# Patient Record
Sex: Female | Born: 1945
Health system: Southern US, Community
[De-identification: ages and names within clinical notes are randomized; demographics above are authoritative.]

## PROBLEM LIST (undated history)

## (undated) DIAGNOSIS — N200 Calculus of kidney: Secondary | ICD-10-CM

## (undated) DIAGNOSIS — Z87442 Personal history of urinary calculi: Secondary | ICD-10-CM

## (undated) HISTORY — PX: TONSILLECTOMY: SUR1361

---
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
## 2012-03-06 ENCOUNTER — Emergency Department (HOSPITAL_COMMUNITY)
Admission: EM | Admit: 2012-03-06 | Discharge: 2012-03-06 | Disposition: A | Discharge status: Home / Self Care | Payer: Self-pay | Attending: Emergency Medicine | Admitting: Emergency Medicine

## 2012-03-06 ENCOUNTER — Encounter (HOSPITAL_COMMUNITY): Payer: Self-pay | Admitting: Emergency Medicine

## 2012-03-06 DIAGNOSIS — B029 Zoster without complications: Secondary | ICD-10-CM | POA: Insufficient documentation

## 2012-03-06 MED ORDER — ACYCLOVIR 400 MG PO TABS: 800.0000 mg | ORAL_TABLET | Freq: Every day | ORAL | Status: AC

## 2012-03-06 MED ORDER — OXYCODONE-ACETAMINOPHEN 10-325 MG PO TABS: 1.0000 | ORAL_TABLET | ORAL | Status: DC | PRN

## 2012-03-06 MED ORDER — HYDROXYZINE HCL 25 MG PO TABS: 25.0000 mg | ORAL_TABLET | Freq: Four times a day (QID) | ORAL | Status: AC

## 2012-03-06 MED ORDER — OXYCODONE-ACETAMINOPHEN 5-325 MG/5ML PO SOLN: 10.0000 mL | ORAL | Status: DC | PRN

## 2012-03-06 NOTE — ED Notes (Signed)
Pt noted onset of abdominal pain one week ago, starting on the inside then pain radiating outside. Noted rash that broke out on Tuesday and has continued to worsen. Nausea w/o emesis.

## 2012-03-06 NOTE — ED Provider Notes (Signed)
History     CSN: [id number]  Arrival date & time 03/06/12  1301   First MD Initiated Contact with Patient 03/06/12 1403      Chief Complaint  Patient presents with  . Herpes Zoster    (Consider location/radiation/quality/duration/timing/severity/associated sxs/prior treatment) The history is provided by the patient and medical records.  Regina Walters is a 66 y.o. female presents to the emergency department complaining of rash along the right thorax.  The onset of the symptoms was  gradual starting 1 week ago.  The patient has associated pain, burning, tingling.  The symptoms have been  persistent, gradually worsened.  Nothing makes the symptoms worse and ibuprofen makes symptoms better.  The patient denies fever, chills, headache, eye pain or redness, nausea, vomiting, diarrhea, chest pain, shortness of breath, weakness.  Patient states about 1-1/2 weeks ago she began to feel some discomfort in her back and along her right thorax. She saw her primary care physician who diagnosed her with a muscle strain. 3 days later her rash erupted. She has been dealing with it for one week.  She describes the pain as burning and tingling.  She endorses a history of chickenpox as a child.     History reviewed. No pertinent past medical history.  Past Surgical History  Procedure Date  . Tonsillectomy     No family history on file.  History  Substance Use Topics  . Smoking status: Never Smoker   . Smokeless tobacco: Never Used  . Alcohol Use: No    OB History    Grav Para Term Preterm Abortions TAB SAB Ect Mult Living                  Review of Systems  Constitutional: Negative for fever, diaphoresis, appetite change, fatigue and unexpected weight change.  HENT: Negative for mouth sores and neck stiffness.   Eyes: Negative for visual disturbance.  Respiratory: Negative for cough, chest tightness, shortness of breath and wheezing.   Cardiovascular: Negative for chest pain.    Gastrointestinal: Negative for nausea, vomiting, abdominal pain, diarrhea and constipation.  Genitourinary: Negative for dysuria, urgency, frequency and hematuria.  Skin: Positive for rash.  Neurological: Negative for syncope, light-headedness and headaches.  Psychiatric/Behavioral: Negative for disturbed wake/sleep cycle. The patient is not nervous/anxious.     Allergies  Review of patient's allergies indicates no known allergies.  Home Medications   Current Outpatient Rx  Name Route Sig Dispense Refill  . ESOMEPRAZOLE MAGNESIUM 40 MG PO CPDR Oral Take 40 mg by mouth daily before breakfast.    . IBUPROFEN 200 MG PO TABS Oral Take 600 mg by mouth every 6 (six) hours as needed. Pain    . NAPROXEN SODIUM 220 MG PO TABS Oral Take 220 mg by mouth 2 (two) times daily with a meal.      BP 163/76  Pulse 80  Temp 98.4 F (36.9 C) (Oral)  Resp 18  SpO2 99%  Physical Exam  Nursing note and vitals reviewed. Constitutional: She is oriented to person, place, and time. She appears well-developed and well-nourished. No distress.  HENT:  Head: Normocephalic and atraumatic.  Mouth/Throat: Oropharynx is clear and moist. No oropharyngeal exudate.  Eyes: Conjunctivae and EOM are normal. Pupils are equal, round, and reactive to light. No scleral icterus.  Neck: Normal range of motion. Neck supple.  Cardiovascular: Normal rate, regular rhythm, normal heart sounds and intact distal pulses.  Exam reveals no gallop and no friction rub.   No murmur  heard. Pulmonary/Chest: Effort normal and breath sounds normal. No respiratory distress. She has no wheezes.  Musculoskeletal: Normal range of motion. She exhibits no edema.  Neurological: She is alert and oriented to person, place, and time.       Speech is clear and goal oriented Moves extremities without ataxia  Skin: Skin is warm and dry. Rash noted. No bruising noted. Rash is vesicular. She is not diaphoretic. There is erythema.          Large  erythematous grouping of vesicular lesions, some ruptured with crusting, in a dermatomal pattern along the right thorax  Psychiatric: She has a normal mood and affect.    ED Course  Procedures (including critical care time)  Labs Reviewed - No data to display No results found.   1. Shingles       MDM  Cleotis Lema sense with dermatomal rash along the right thorax.  Vesicular lesions consistent with shingles.  No evidence of infection or cellulitis.  Patient with history of chickenpox as a child.  I discussed wound care of the rash.  I've also discussed the possibility of postherpetic neuralgia.  I have also discussed reasons to return immediately to the ER.  Patient expresses understanding and agrees with plan.  1. Medications: Acyclovir, Atarax, Percocet 2. Treatment: Gen. scratch or itch, keep area clean and dry, wear loosefitting clothing 3. Follow Up: Primary care physician if pain persists greater than 3 weeks        Dierdre Forth, PA-C 03/06/12 1425

## 2012-03-07 NOTE — ED Provider Notes (Signed)
Medical screening examination/treatment/procedure(s) were performed by non-physician practitioner and as supervising physician I was immediately available for consultation/collaboration.  Chanah Tidmore, MD 03/07/12 0704 

## 2012-07-06 DIAGNOSIS — N200 Calculus of kidney: Secondary | ICD-10-CM

## 2012-07-06 HISTORY — DX: Calculus of kidney: N20.0

## 2013-04-09 DIAGNOSIS — K7689 Other specified diseases of liver: Secondary | ICD-10-CM | POA: Insufficient documentation

## 2016-03-25 DIAGNOSIS — Z0000 Encounter for general adult medical examination without abnormal findings: Secondary | ICD-10-CM | POA: Diagnosis not present

## 2016-03-25 DIAGNOSIS — Z131 Encounter for screening for diabetes mellitus: Secondary | ICD-10-CM | POA: Diagnosis not present

## 2016-03-25 DIAGNOSIS — Z136 Encounter for screening for cardiovascular disorders: Secondary | ICD-10-CM | POA: Diagnosis not present

## 2017-09-29 DIAGNOSIS — H2513 Age-related nuclear cataract, bilateral: Secondary | ICD-10-CM | POA: Diagnosis not present

## 2019-04-03 ENCOUNTER — Other Ambulatory Visit (HOSPITAL_BASED_OUTPATIENT_CLINIC_OR_DEPARTMENT_OTHER): Payer: Self-pay | Admitting: Family Medicine

## 2019-04-03 DIAGNOSIS — R1011 Right upper quadrant pain: Secondary | ICD-10-CM

## 2019-04-05 ENCOUNTER — Ambulatory Visit (HOSPITAL_BASED_OUTPATIENT_CLINIC_OR_DEPARTMENT_OTHER)
Admission: RE | Admit: 2019-04-05 | Discharge: 2019-04-05 | Disposition: A | Discharge status: Home / Self Care | Payer: PPO | Source: Ambulatory Visit | Attending: Family Medicine | Admitting: Family Medicine

## 2019-04-05 ENCOUNTER — Encounter (HOSPITAL_COMMUNITY): Payer: Self-pay | Admitting: Emergency Medicine

## 2019-04-05 ENCOUNTER — Emergency Department (HOSPITAL_COMMUNITY)
Admission: EM | Admit: 2019-04-05 | Discharge: 2019-04-05 | Discharge status: Against Medical Advice | Payer: PPO | Attending: Emergency Medicine | Admitting: Emergency Medicine

## 2019-04-05 ENCOUNTER — Other Ambulatory Visit (HOSPITAL_BASED_OUTPATIENT_CLINIC_OR_DEPARTMENT_OTHER): Payer: Self-pay | Admitting: Family Medicine

## 2019-04-05 ENCOUNTER — Emergency Department (HOSPITAL_COMMUNITY): Payer: PPO

## 2019-04-05 ENCOUNTER — Other Ambulatory Visit: Payer: Self-pay

## 2019-04-05 DIAGNOSIS — R1011 Right upper quadrant pain: Secondary | ICD-10-CM

## 2019-04-05 DIAGNOSIS — K824 Cholesterolosis of gallbladder: Secondary | ICD-10-CM | POA: Diagnosis not present

## 2019-04-05 DIAGNOSIS — Z5321 Procedure and treatment not carried out due to patient leaving prior to being seen by health care provider: Secondary | ICD-10-CM | POA: Diagnosis not present

## 2019-04-05 DIAGNOSIS — K8020 Calculus of gallbladder without cholecystitis without obstruction: Secondary | ICD-10-CM | POA: Diagnosis not present

## 2019-04-05 LAB — COMPREHENSIVE METABOLIC PANEL
ALT: 120 U/L — ABNORMAL HIGH (ref 0–44)
AST: 277 U/L — ABNORMAL HIGH (ref 15–41)
Albumin: 3.8 g/dL (ref 3.5–5.0)
Alkaline Phosphatase: 271 U/L — ABNORMAL HIGH (ref 38–126)
Anion gap: 7 (ref 5–15)
BUN: 9 mg/dL (ref 8–23)
CO2: 28 mmol/L (ref 22–32)
Calcium: 9.4 mg/dL (ref 8.9–10.3)
Chloride: 103 mmol/L (ref 98–111)
Creatinine, Ser: 0.63 mg/dL (ref 0.44–1.00)
GFR calc Af Amer: >60 mL/min (ref >60)
GFR calc non Af Amer: >60 mL/min (ref >60)
Glucose, Bld: 156 mg/dL — ABNORMAL HIGH (ref 70–99)
Potassium: 3.2 mmol/L — ABNORMAL LOW (ref 3.5–5.1)
Sodium: 138 mmol/L (ref 135–145)
Total Bilirubin: 1.4 mg/dL — ABNORMAL HIGH (ref 0.3–1.2)
Total Protein: 6.9 g/dL (ref 6.5–8.1)

## 2019-04-05 LAB — CBC
HCT: 39.2 % (ref 36.0–46.0)
Hemoglobin: 12.9 g/dL (ref 12.0–15.0)
MCH: 28.9 pg (ref 26.0–34.0)
MCHC: 32.9 g/dL (ref 30.0–36.0)
MCV: 87.7 fL (ref 80.0–100.0)
Platelets: 239 10*3/uL (ref 150–400)
RBC: 4.47 MIL/uL (ref 3.87–5.11)
RDW: 13.2 % (ref 11.5–15.5)
WBC: 7.1 10*3/uL (ref 4.0–10.5)
nRBC: 0 % (ref 0.0–0.2)

## 2019-04-05 LAB — LIPASE, BLOOD: Lipase: 37 U/L (ref 11–51)

## 2019-04-05 MED ORDER — SODIUM CHLORIDE 0.9% FLUSH: 3.0000 mL | Freq: Once | INTRAVENOUS | Status: DC

## 2019-04-05 NOTE — ED Notes (Signed)
If pt returns let ultrasound know

## 2019-04-05 NOTE — ED Notes (Signed)
Registration states pt never returned and that she called her number on file with no answer

## 2019-04-05 NOTE — ED Notes (Signed)
Pt called for [place] in lobby and outside. No answer

## 2019-04-05 NOTE — ED Triage Notes (Signed)
Pt c/o nausea and intermittent RUQ pain. Has been seen for the same, scheduled for [place] on Friday. Denies pain/emesis at this time.

## 2019-04-07 ENCOUNTER — Ambulatory Visit (HOSPITAL_BASED_OUTPATIENT_CLINIC_OR_DEPARTMENT_OTHER): Payer: PPO

## 2019-04-10 ENCOUNTER — Ambulatory Visit: Payer: Self-pay | Admitting: Surgery

## 2019-04-10 ENCOUNTER — Encounter: Payer: Self-pay | Admitting: Surgery

## 2019-04-10 DIAGNOSIS — K812 Acute cholecystitis with chronic cholecystitis: Secondary | ICD-10-CM | POA: Insufficient documentation

## 2019-04-10 DIAGNOSIS — K8010 Calculus of gallbladder with chronic cholecystitis without obstruction: Secondary | ICD-10-CM | POA: Diagnosis not present

## 2019-04-10 DIAGNOSIS — K7689 Other specified diseases of liver: Secondary | ICD-10-CM

## 2019-04-10 DIAGNOSIS — K219 Gastro-esophageal reflux disease without esophagitis: Secondary | ICD-10-CM

## 2019-04-10 DIAGNOSIS — Q446 Cystic disease of liver: Secondary | ICD-10-CM | POA: Diagnosis not present

## 2019-04-10 NOTE — H&P (Signed)
Regina Walters Documented: 04/10/2019 11:28 AM Location: [facility] Patient #: [id number] DOB: 04-16-1946 Married / Language: [place] / Race: White Female  History of Present Illness Regina Sportsman MD; 04/10/2019 11:53 AM) The patient is a 73 year old female who presents for evaluation of gall stones. 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 She has an history of intermittent right upper quadrant abdominal pain with nausea and vomiting. Recurring symptoms. Gallbladder suspected. Dr. Lenise Walters ordered an ultrasound which was done last week. She did not stay for an ER evaluation. It showed gallstones. Also some right hepatic liver cysts as well. Patient has a history of a kidney stone and thousand 14 with small renal cysts noted then. She's had 6 attacks in the past several months. Starts his slightly right-sided epigastric pain that radiates to her back with occasional nausea and vomiting. She has a history of heartburn and reflux usually well controlled with Nexium. She does not drink or smoke. She is intentionally lost about 8 pounds. Tries to be physically active. Usually moves her bowels every day. She does not get mammograms nor has had a colonoscopy. No history of heart attack or stroke. She did have a kidney stone on the left side back in 2014. No other abdominal issues. She's never had any abdominal surgery. Just a tonsillectomy.  No personal nor family history of GI/colon cancer, inflammatory bowel disease, irritable bowel syndrome, allergy such as Celiac Sprue, dietary/dairy problems, colitis, ulcers nor gastritis. No recent sick contacts/gastroenteritis. No travel outside the country. No changes in diet. No dysphagia to solids or liquids. No hematochezia, hematemesis, coffee ground emesis. No evidence of  prior gastric/peptic ulceration.  (Review of systems as stated in this history (HPI) or in the review of systems. Otherwise all other 12 point ROS are negative) ` ` `   Problem List/Past Medical Regina Sportsman, MD; 04/10/2019 11:51 AM) KIDNEY STONE ON LEFT SIDE (N20.0) [2014]:  Past Surgical History (Regina Walters, RMA; 04/10/2019 11:28 AM) Tonsillectomy  Diagnostic Studies History (Regina Walters, RMA; 04/10/2019 11:28 AM) Colonoscopy never Pap Smear >5 years ago  Allergies (Regina Walters, RMA; 04/10/2019 11:29 AM) No Known Drug Allergies [04/10/2019]: Allergies Reconciled  Medication History (Regina Walters, RMA; 04/10/2019 11:30 AM) NexIUM (40MG  Capsule DR, Oral) Active. Ibuprofen (200MG  Tablet, Oral) Active. Medications Reconciled  Social History (Regina A. , RMA; 04/10/2019 11:28 AM) Tobacco use Former smoker.  Family History (Regina Walters, RMA; 04/10/2019 11:28 AM) Colon Polyps Brother.  Pregnancy / Birth History (Regina Walters, RMA; 04/10/2019 11:28 AM) Age at menarche 15 years. Age of menopause 64-55 Gravida 4 Maternal age 24-20 Para 4  Other Problems 46-48, MD; 04/10/2019 11:51 AM) Back Pain Gastroesophageal Reflux Disease     Review of Systems (Regina Walters RMA; 04/10/2019 11:28 AM) General Present- Fatigue. Not Present- Appetite Loss, Chills, Fever, Night Sweats, Weight Gain and Weight Loss. Skin Not Present- Change in Wart/Mole, Dryness, Hives, Jaundice, New Lesions, Non-Healing Wounds, Rash and Ulcer. HEENT Present- Seasonal Allergies and Wears glasses/contact lenses. Not Present- Earache, Hearing Loss, Hoarseness, Nose Bleed, Oral Ulcers, Ringing in the Ears, Sinus Pain, Sore Throat, Visual Disturbances and Yellow Eyes. Cardiovascular Not Present- Chest Pain, Difficulty Breathing Lying Down, Leg Cramps, Palpitations, Rapid Heart Rate, Shortness of Breath and Swelling of Extremities. Gastrointestinal  Present- Abdominal  Pain, Bloating, Nausea and Vomiting. Not Present- Bloody Stool, Change in Bowel Habits, Chronic diarrhea, Constipation, Difficulty Swallowing, Excessive gas, Gets full quickly at meals, Hemorrhoids, Indigestion and Rectal Pain. Female Genitourinary Not Present- Frequency, Nocturia, Painful Urination, Pelvic Pain and Urgency. Musculoskeletal Not Present- Back Pain, Joint Pain, Joint Stiffness, Muscle Pain, Muscle Weakness and Swelling of Extremities. Neurological Not Present- Decreased Memory, Fainting, Headaches, Numbness, Seizures, Tingling, Tremor, Trouble walking and Weakness. Psychiatric Not Present- Anxiety, Bipolar, Change in Sleep Pattern, Depression, Fearful and Frequent crying. Endocrine Not Present- Cold Intolerance, Excessive Hunger, Hair Changes, Heat Intolerance, Hot flashes and New Diabetes. Hematology Not Present- Blood Thinners, Easy Bruising, Excessive bleeding, Gland problems, HIV and Persistent Infections.  Vitals (Regina Walters RMA; 04/10/2019 11:29 AM) 04/10/2019 11:28 AM Weight: 136.8 lb Height: 58in Body Surface Area: 1.55 m Body Mass Index: 28.59 kg/m  Temp.: 97.38F  Pulse: 98 (Regular)  BP: 124/84 (Sitting, Left Arm, Standard)        Physical Exam Regina Walters(Regina Forstrom C. Zonie Crutcher MD; 04/10/2019 11:45 AM)  General Mental Status-Alert. General Appearance-Not in acute distress, Not Sickly. Orientation-Oriented X3. Hydration-Well hydrated. Voice-Normal.  Integumentary Global Assessment Upon inspection and palpation of skin surfaces of the - Axillae: non-tender, no inflammation or ulceration, no drainage. and Distribution of scalp and body hair is normal. General Characteristics Temperature - normal warmth is noted.  Head and Neck Head-normocephalic, atraumatic with no lesions or palpable masses. Face Global Assessment - atraumatic, no absence of expression. Neck Global Assessment - no abnormal movements, no bruit auscultated  on the right, no bruit auscultated on the left, no decreased range of motion, non-tender. Trachea-midline. Thyroid Gland Characteristics - non-tender.  Eye Eyeball - Left-Extraocular movements intact, No Nystagmus - Left. Eyeball - Right-Extraocular movements intact, No Nystagmus - Right. Cornea - Left-No Hazy - Left. Cornea - Right-No Hazy - Right. Sclera/Conjunctiva - Left-No scleral icterus, No Discharge - Left. Sclera/Conjunctiva - Right-No scleral icterus, No Discharge - Right. Pupil - Left-Direct reaction to light normal. Pupil - Right-Direct reaction to light normal.  ENMT Ears Pinna - Left - no drainage observed, no generalized tenderness observed. Pinna - Right - no drainage observed, no generalized tenderness observed. Nose and Sinuses External Inspection of the Nose - no destructive lesion observed. Inspection of the nares - Left - quiet respiration. Inspection of the nares - Right - quiet respiration. Mouth and Throat Lips - Upper Lip - no fissures observed, no pallor noted. Lower Lip - no fissures observed, no pallor noted. Nasopharynx - no discharge present. Oral Cavity/Oropharynx - Tongue - no dryness observed. Oral Mucosa - no cyanosis observed. Hypopharynx - no evidence of airway distress observed.  Chest and Lung Exam Inspection Movements - Normal and Symmetrical. Accessory muscles - No use of accessory muscles in breathing. Palpation Palpation of the chest reveals - Non-tender. Auscultation Breath sounds - Normal and Clear.  Cardiovascular Auscultation Rhythm - Regular. Murmurs & Other Heart Sounds - Auscultation of the heart reveals - No Murmurs and No Systolic Clicks.  Abdomen Inspection Inspection of the abdomen reveals - No Visible peristalsis and No Abnormal pulsations. Umbilicus - No Bleeding, No Urine drainage. Palpation/Percussion Palpation and Percussion of the abdomen reveal - Soft, Non Tender, No Rebound tenderness, No Rigidity  (guarding) and No Cutaneous hyperesthesia. Note: Abdomen soft. Not severely distended. No distasis recti. No umbilical or other anterior abdominal wall hernias  Female Genitourinary Sexual Maturity Tanner 5 - Adult hair pattern. Note: No vaginal bleeding nor discharge  Peripheral Vascular Upper Extremity Inspection -  Left - No Cyanotic nailbeds - Left, Not Ischemic. Inspection - Right - No Cyanotic nailbeds - Right, Not Ischemic.  Neurologic Neurologic evaluation reveals -normal attention span and ability to concentrate, able to name objects and repeat phrases. Appropriate fund of knowledge , normal sensation and normal coordination. Mental Status Affect - not angry, not paranoid. Cranial Nerves-Normal Bilaterally. Gait-Normal.  Neuropsychiatric Mental status exam performed with findings of-able to articulate well with normal speech/language, rate, volume and coherence, thought content normal with ability to perform basic computations and apply abstract reasoning and no evidence of hallucinations, delusions, obsessions or homicidal/suicidal ideation.  Musculoskeletal Global Assessment Spine, Ribs and Pelvis - no instability, subluxation or laxity. Right Upper Extremity - no instability, subluxation or laxity.  Lymphatic Head & Neck  General Head & Neck Lymphatics: Bilateral - Description - No Localized lymphadenopathy. Axillary  General Axillary Region: Bilateral - Description - No Localized lymphadenopathy. Femoral & Inguinal  Generalized Femoral & Inguinal Lymphatics: Left - Description - No Localized lymphadenopathy. Right - Description - No Localized lymphadenopathy.    Assessment & Plan [name] MD; 04/10/2019 11:47 AM)  CHRONIC CHOLECYSTITIS WITH CALCULUS (K80.10) Impression: Story of postprandial nausea vomiting and right-sided abdominal pain radiating to the back classic for biliary colic.  The differential diagnosis seems unlikely.  I think  she would benefit from cholecystectomy to break the cycle attacks. She is interested in proceeding  Current Plans You are being scheduled for surgery- Our schedulers will call you.  You should hear from our office's scheduling department within 5 working days about the location, date, and time of surgery. We try to make accommodations for patient's preferences in scheduling surgery, but sometimes the OR schedule or the surgeon's schedule prevents [place] from making those accommodations.  If you have not heard from our office [phone]) in 5 working days, call the office and ask for your surgeon's nurse.  If you have other questions about your diagnosis, plan, or surgery, call the office and ask for your surgeon's nurse.  Written instructions provided Pt Education - Pamphlet Given - Laparoscopic Gallbladder Surgery: discussed with patient and provided information. The anatomy & physiology of hepatobiliary & pancreatic function was discussed. The pathophysiology of gallbladder dysfunction was discussed. Natural history risks without surgery was discussed. I feel the risks of no intervention will lead to serious problems that outweigh the operative risks; therefore, I recommended cholecystectomy to remove the pathology. I explained laparoscopic techniques with possible need for an open approach. Probable cholangiogram to evaluate the bilary tract was explained as well.  Risks such as bleeding, infection, abscess, leak, injury to other organs, need for further treatment, heart attack, death, and other risks were discussed. I noted a good likelihood this will help address the problem. Possibility that this will not correct all abdominal symptoms was explained. Goals of post-operative recovery were discussed as well. We will work to minimize complications. An educational handout further explaining the pathology and treatment options was given as well. Questions were answered. The patient  expresses understanding & wishes to proceed with surgery.  Pt Education - [facility] Laparosopic Post Op HCI (Kiele Heavrin) Pt Education - [facility] Good Bowel Health ([facility]um [place]ner) Pt Education - Laparoscopic Cholecystectomy: gallbladder  CYSTIC DISEASE OF LIVER (Q44.6) Impression: Cystic lesions noted in the kidney on ultrasound. Also seen on CT scan in 2014. Both incisions presumed to be benign  [name], MD, FACS, MASCRS Gastrointestinal and Minimally Invasive Surgery    [address], [place], [place]  [id number] [phone] Main / Paging [phone] Fax

## 2019-04-10 NOTE — H&P (Signed)
Regina Walters Documented: 04/10/2019 11:28 AM Location: [facility] Patient #: [id number] DOB: 11-02-1945 Married / Language: [name] / Race: White Female  History of Present Illness [name] MD; 04/10/2019 11:50 AM) The patient is a 73 year old female who presents for evaluation of gall stones. 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 She has an history of intermittent right upper quadrant abdominal pain with nausea and vomiting. Recurring symptoms. Gallbladder suspected. Dr. [name] ordered an ultrasound which was done last week. She did not stay for an ER evaluation. It showed gallstones. Also some right hepatic liver cysts as well. Patient has a history of a kidney stone and thousand 14 with small renal cysts noted then. She's had 6 attacks in the past several months. Starts his slightly right-sided epigastric pain that radiates to her back with occasional nausea and vomiting. She has a history of heartburn and reflux usually well controlled with Nexium. She does not drink or smoke. She is intentionally lost about 8 pounds. Tries to be physically active. Usually moves her bowels every day. She does not get mammograms nor has had a colonoscopy. No history of heart attack or stroke.  No personal nor family history of GI/colon cancer, inflammatory bowel disease, irritable bowel syndrome, allergy such as Celiac Sprue, dietary/dairy problems, colitis, ulcers nor gastritis. No recent sick contacts/gastroenteritis. No travel outside the country. No changes in diet. No dysphagia to solids or liquids. No hematochezia, hematemesis, coffee ground emesis. No evidence of prior gastric/peptic ulceration.  (Review of systems as stated in this history (HPI) or in the review of systems. Otherwise all other 12 point ROS are  negative) ` ` `   Past Surgical History ([name], [place]; 04/10/2019 11:28 AM) Tonsillectomy  Diagnostic Studies History ([name], [place]; 04/10/2019 11:28 AM) Colonoscopy never Pap Smear >5 years ago  Allergies ([name], [place]; 04/10/2019 11:29 AM) No Known Drug Allergies [04/10/2019]: Allergies Reconciled  Medication History ([name], RMA; 04/10/2019 11:30 AM) NexIUM (40MG  Capsule DR, Oral) Active. Ibuprofen (200MG  Tablet, Oral) Active. Medications Reconciled  Social History ([name], [place]; 04/10/2019 11:28 AM) Tobacco use Former smoker.  Family History ([name], [place]; 04/10/2019 11:28 AM) Colon Polyps Brother.  Pregnancy / Birth History ([name], [place]; 04/10/2019 11:28 AM) Age at menarche 10 years. Age of menopause 36-55 Gravida 4 Maternal age 78-20 Para 33  Other Problems ([name], [place]; 04/10/2019 11:28 AM) Back Pain Gastroesophageal Reflux Disease     Review of Systems ([name]; 04/10/2019 11:28 AM) General Present- Fatigue. Not Present- Appetite Loss, Chills, Fever, Night Sweats, Weight Gain and Weight Loss. Skin Not Present- Change in Wart/Mole, Dryness, Hives, Jaundice, New Lesions, Non-Healing Wounds, Rash and Ulcer. HEENT Present- Seasonal Allergies and Wears glasses/contact lenses. Not Present- Earache, Hearing Loss, Hoarseness, Nose Bleed, Oral Ulcers, Ringing in the Ears, Sinus Pain, Sore Throat, Visual Disturbances and Yellow Eyes. Cardiovascular Not Present- Chest Pain, Difficulty Breathing Lying Down, Leg Cramps, Palpitations, Rapid Heart Rate, Shortness of Breath and Swelling of Extremities. Gastrointestinal Present- Abdominal Pain, Bloating, Nausea and Vomiting. Not Present- Bloody Stool, Change in Bowel Habits, Chronic diarrhea, Constipation, Difficulty Swallowing, Excessive gas, Gets full quickly at meals, Hemorrhoids, Indigestion and Rectal Pain. Female Genitourinary  Not Present- Frequency, Nocturia, Painful Urination, Pelvic Pain and Urgency. Musculoskeletal Not  Present- Back Pain, Joint Pain, Joint Stiffness, Muscle Pain, Muscle Weakness and Swelling of Extremities. Neurological Not Present- Decreased Memory, Fainting, Headaches, Numbness, Seizures, Tingling, Tremor, Trouble walking and Weakness. Psychiatric Not Present- Anxiety, Bipolar, Change in Sleep Pattern, Depression, Fearful and Frequent crying. Endocrine Not Present- Cold Intolerance, Excessive Hunger, Hair Changes, Heat Intolerance, Hot flashes and New Diabetes. Hematology Not Present- Blood Thinners, Easy Bruising, Excessive bleeding, Gland problems, HIV and Persistent Infections.  Vitals ([name]; 04/10/2019 11:29 AM) 04/10/2019 11:28 AM Weight: 136.8 lb Height: 58in Body Surface Area: 1.55 m Body Mass Index: 28.59 kg/m  Temp.: 97.4F  Pulse: 98 (Regular)  BP: 124/84 (Sitting, Left Arm, Standard)        Physical Exam [name](Kalyani Maeda C. Emiel Kielty MD; 04/10/2019 11:45 AM)  General Mental Status-Alert. General Appearance-Not in acute distress, Not Sickly. Orientation-Oriented X3. Hydration-Well hydrated. Voice-Normal.  Integumentary Global Assessment Upon inspection and palpation of skin surfaces of the - Axillae: non-tender, no inflammation or ulceration, no drainage. and Distribution of scalp and body hair is normal. General Characteristics Temperature - normal warmth is noted.  Head and Neck Head-normocephalic, atraumatic with no lesions or palpable masses. Face Global Assessment - atraumatic, no absence of expression. Neck Global Assessment - no abnormal movements, no bruit auscultated on the right, no bruit auscultated on the left, no decreased range of motion, non-tender. Trachea-midline. Thyroid Gland Characteristics - non-tender.  Eye Eyeball - Left-Extraocular movements intact, No Nystagmus - Left. Eyeball - Right-Extraocular  movements intact, No Nystagmus - Right. Cornea - Left-No Hazy - Left. Cornea - Right-No Hazy - Right. Sclera/Conjunctiva - Left-No scleral icterus, No Discharge - Left. Sclera/Conjunctiva - Right-No scleral icterus, No Discharge - Right. Pupil - Left-Direct reaction to light normal. Pupil - Right-Direct reaction to light normal.  ENMT Ears Pinna - Left - no drainage observed, no generalized tenderness observed. Pinna - Right - no drainage observed, no generalized tenderness observed. Nose and Sinuses External Inspection of the Nose - no destructive lesion observed. Inspection of the nares - Left - quiet respiration. Inspection of the nares - Right - quiet respiration. Mouth and Throat Lips - Upper Lip - no fissures observed, no pallor noted. Lower Lip - no fissures observed, no pallor noted. Nasopharynx - no discharge present. Oral Cavity/Oropharynx - Tongue - no dryness observed. Oral Mucosa - no cyanosis observed. Hypopharynx - no evidence of airway distress observed.  Chest and Lung Exam Inspection Movements - Normal and Symmetrical. Accessory muscles - No use of accessory muscles in breathing. Palpation Palpation of the chest reveals - Non-tender. Auscultation Breath sounds - Normal and Clear.  Cardiovascular Auscultation Rhythm - Regular. Murmurs & Other Heart Sounds - Auscultation of the heart reveals - No Murmurs and No Systolic Clicks.  Abdomen Inspection Inspection of the abdomen reveals - No Visible peristalsis and No Abnormal pulsations. Umbilicus - No Bleeding, No Urine drainage. Palpation/Percussion Palpation and Percussion of the abdomen reveal - Soft, Non Tender, No Rebound tenderness, No Rigidity (guarding) and No Cutaneous hyperesthesia. Note: Abdomen soft. Not severely distended. No distasis recti. No umbilical or other anterior abdominal wall hernias  Female Genitourinary Sexual Maturity Tanner 5 - Adult hair pattern. Note: No vaginal bleeding  nor discharge  Peripheral Vascular Upper Extremity Inspection - Left - No Cyanotic nailbeds - Left, Not Ischemic. Inspection - Right - No Cyanotic nailbeds - Right, Not Ischemic.  Neurologic Neurologic evaluation reveals -normal attention span and ability to concentrate, able to name objects and repeat phrases. Appropriate fund of knowledge ,  normal sensation and normal coordination. Mental Status Affect - not angry, not paranoid. Cranial Nerves-Normal Bilaterally. Gait-Normal.  Neuropsychiatric Mental status exam performed with findings of-able to articulate well with normal speech/language, rate, volume and coherence, thought content normal with ability to perform basic computations and apply abstract reasoning and no evidence of hallucinations, delusions, obsessions or homicidal/suicidal ideation.  Musculoskeletal Global Assessment Spine, Ribs and Pelvis - no instability, subluxation or laxity. Right Upper Extremity - no instability, subluxation or laxity.  Lymphatic Head & Neck  General Head & Neck Lymphatics: Bilateral - Description - No Localized lymphadenopathy. Axillary  General Axillary Region: Bilateral - Description - No Localized lymphadenopathy. Femoral & Inguinal  Generalized Femoral & Inguinal Lymphatics: Left - Description - No Localized lymphadenopathy. Right - Description - No Localized lymphadenopathy.    Assessment & Plan [name] MD; 04/10/2019 11:47 AM)  CHRONIC CHOLECYSTITIS WITH CALCULUS (K80.10) Impression: Story of postprandial nausea vomiting and right-sided abdominal pain radiating to the back classic for biliary colic.  The differential diagnosis seems unlikely.  I think she would benefit from cholecystectomy to break the cycle attacks. She is interested in proceeding  Current Plans You are being scheduled for surgery- Our schedulers will call you.  You should hear from our office's scheduling department within 5 working days  about the location, date, and time of surgery. We try to make accommodations for patient's preferences in scheduling surgery, but sometimes the OR schedule or the surgeon's schedule prevents [place] from making those accommodations.  If you have not heard from our office [phone] in 5 working days, call the office and ask for your surgeon's nurse.  If you have other questions about your diagnosis, plan, or surgery, call the office and ask for your surgeon's nurse.  Written instructions provided Pt Education - Pamphlet Given - Laparoscopic Gallbladder Surgery: discussed with patient and provided information. The anatomy & physiology of hepatobiliary & pancreatic function was discussed. The pathophysiology of gallbladder dysfunction was discussed. Natural history risks without surgery was discussed. I feel the risks of no intervention will lead to serious problems that outweigh the operative risks; therefore, I recommended cholecystectomy to remove the pathology. I explained laparoscopic techniques with possible need for an open approach. Probable cholangiogram to evaluate the bilary tract was explained as well.  Risks such as bleeding, infection, abscess, leak, injury to other organs, need for further treatment, heart attack, death, and other risks were discussed. I noted a good likelihood this will help address the problem. Possibility that this will not correct all abdominal symptoms was explained. Goals of post-operative recovery were discussed as well. We will work to minimize complications. An educational handout further explaining the pathology and treatment options was given as well. Questions were answered. The patient expresses understanding & wishes to proceed with surgery.  Pt Education - CCS Laparosopic Post Op HCI (Yajaira Doffing) Pt Education - CCS Good Bowel Health (Carilyn Woolston) Pt Education - Laparoscopic Cholecystectomy: gallbladder  CYSTIC DISEASE OF LIVER (Q44.6) Impression:  Cystic lesions noted in the kidney on ultrasound. Also seen on CT scan in 2014. Both incisions presumed to be benign.  [name], MD, FACS, MASCRS Gastrointestinal and Minimally Invasive Surgery    [address], [address] [place], [place] [address] [phone] Main / Paging [phone] Fax

## 2019-04-10 NOTE — H&P (View-Only) (Signed)
Regina Walters Documented: 04/10/2019 11:28 AM Location: [facility] Patient #: [id number] DOB: 11-02-1945 Married / Language: Regina Walters / Race: White Female  History of Present Illness Regina Hector MD; 04/10/2019 11:50 AM) The patient is a 73 year old female who presents for evaluation of gall stones. 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 She has an history of intermittent right upper quadrant abdominal pain with nausea and vomiting. Recurring symptoms. Gallbladder suspected. Dr. Olen Walters ordered an ultrasound which was done last week. She did not stay for an ER evaluation. It showed gallstones. Also some right hepatic liver cysts as well. Patient has a history of a kidney stone and thousand 14 with small renal cysts noted then. She's had 6 attacks in the past several months. Starts his slightly right-sided epigastric pain that radiates to her back with occasional nausea and vomiting. She has a history of heartburn and reflux usually well controlled with Nexium. She does not drink or smoke. She is intentionally lost about 8 pounds. Tries to be physically active. Usually moves her bowels every day. She does not get mammograms nor has had a colonoscopy. No history of heart attack or stroke.  No personal nor family history of GI/colon cancer, inflammatory bowel disease, irritable bowel syndrome, allergy such as Celiac Sprue, dietary/dairy problems, colitis, ulcers nor gastritis. No recent sick contacts/gastroenteritis. No travel outside the country. No changes in diet. No dysphagia to solids or liquids. No hematochezia, hematemesis, coffee ground emesis. No evidence of prior gastric/peptic ulceration.  (Review of systems as stated in this history (HPI) or in the review of systems. Otherwise all other 12 point ROS are  negative) ` ` `   Past Surgical History (Regina Walters, [place]; 04/10/2019 11:28 AM) Tonsillectomy  Diagnostic Studies History (Regina Walters, [place]; 04/10/2019 11:28 AM) Colonoscopy never Pap Smear >5 years ago  Allergies (Regina Walters, Regina Walters; 04/10/2019 11:29 AM) No Known Drug Allergies [04/10/2019]: Allergies Reconciled  Medication History (Regina Walters, Regina Walters; 04/10/2019 11:30 AM) NexIUM (40MG  Capsule DR, Oral) Active. Ibuprofen (200MG  Tablet, Oral) Active. Medications Reconciled  Social History (Regina Walters, Regina Walters; 04/10/2019 11:28 AM) Tobacco use Former smoker.  Family History (Regina Walters, Regina Walters; 04/10/2019 11:28 AM) Colon Polyps Brother.  Pregnancy / Birth History (Regina Walters, Regina Walters; 04/10/2019 11:28 AM) Age at menarche 10 years. Age of menopause 36-55 Gravida 4 Maternal age 78-20 Para 33  Other Problems (Regina Walters, Regina Walters; 04/10/2019 11:28 AM) Back Pain Gastroesophageal Reflux Disease     Review of Systems (Regina A. Regina Walters Regina Walters; 04/10/2019 11:28 AM) General Present- Fatigue. Not Present- Appetite Loss, Chills, Fever, Night Sweats, Weight Gain and Weight Loss. Skin Not Present- Change in Wart/Mole, Dryness, Hives, Jaundice, New Lesions, Non-Healing Wounds, Rash and Ulcer. HEENT Present- Seasonal Allergies and Wears glasses/contact lenses. Not Present- Earache, Hearing Loss, Hoarseness, Nose Bleed, Oral Ulcers, Ringing in the Ears, Sinus Pain, Sore Throat, Visual Disturbances and Yellow Eyes. Cardiovascular Not Present- Chest Pain, Difficulty Breathing Lying Down, Leg Cramps, Palpitations, Rapid Heart Rate, Shortness of Breath and Swelling of Extremities. Gastrointestinal Present- Abdominal Pain, Bloating, Nausea and Vomiting. Not Present- Bloody Stool, Change in Bowel Habits, Chronic diarrhea, Constipation, Difficulty Swallowing, Excessive gas, Gets full quickly at meals, Hemorrhoids, Indigestion and Rectal Pain. Female Genitourinary  Not Present- Frequency, Nocturia, Painful Urination, Pelvic Pain and Urgency. Musculoskeletal Not  Present- Back Pain, Joint Pain, Joint Stiffness, Muscle Pain, Muscle Weakness and Swelling of Extremities. Neurological Not Present- Decreased Memory, Fainting, Headaches, Numbness, Seizures, Tingling, Tremor, Trouble walking and Weakness. Psychiatric Not Present- Anxiety, Bipolar, Change in Sleep Pattern, Depression, Fearful and Frequent crying. Endocrine Not Present- Cold Intolerance, Excessive Hunger, Hair Changes, Heat Intolerance, Hot flashes and New Diabetes. Hematology Not Present- Blood Thinners, Easy Bruising, Excessive bleeding, Gland problems, HIV and Persistent Infections.  Vitals (Regina A. Regina Walters Regina Walters; 04/10/2019 11:29 AM) 04/10/2019 11:28 AM Weight: 136.8 lb Height: 58in Body Surface Area: 1.55 m Body Mass Index: 28.59 kg/m  Temp.: 97.8F  Pulse: 98 (Regular)  BP: 124/84 (Sitting, Left Arm, Standard)        Physical Exam (Regina Aina C. Deatra Mcmahen MD; 04/10/2019 11:45 AM)  General Mental Status-Alert. General Appearance-Not in acute distress, Not Sickly. Orientation-Oriented X3. Hydration-Well hydrated. Voice-Normal.  Integumentary Global Assessment Upon inspection and palpation of skin surfaces of the - Axillae: non-tender, no inflammation or ulceration, no drainage. and Distribution of scalp and body hair is normal. General Characteristics Temperature - normal warmth is noted.  Head and Neck Head-normocephalic, atraumatic with no lesions or palpable masses. Face Global Assessment - atraumatic, no absence of expression. Neck Global Assessment - no abnormal movements, no bruit auscultated on the right, no bruit auscultated on the left, no decreased range of motion, non-tender. Trachea-midline. Thyroid Gland Characteristics - non-tender.  Eye Eyeball - Left-Extraocular movements intact, No Nystagmus - Left. Eyeball - Right-Extraocular  movements intact, No Nystagmus - Right. Cornea - Left-No Hazy - Left. Cornea - Right-No Hazy - Right. Sclera/Conjunctiva - Left-No scleral icterus, No Discharge - Left. Sclera/Conjunctiva - Right-No scleral icterus, No Discharge - Right. Pupil - Left-Direct reaction to light normal. Pupil - Right-Direct reaction to light normal.  ENMT Ears Pinna - Left - no drainage observed, no generalized tenderness observed. Pinna - Right - no drainage observed, no generalized tenderness observed. Nose and Sinuses External Inspection of the Nose - no destructive lesion observed. Inspection of the nares - Left - quiet respiration. Inspection of the nares - Right - quiet respiration. Mouth and Throat Lips - Upper Lip - no fissures observed, no pallor noted. Lower Lip - no fissures observed, no pallor noted. Nasopharynx - no discharge present. Oral Cavity/Oropharynx - Tongue - no dryness observed. Oral Mucosa - no cyanosis observed. Hypopharynx - no evidence of airway distress observed.  Chest and Lung Exam Inspection Movements - Normal and Symmetrical. Accessory muscles - No use of accessory muscles in breathing. Palpation Palpation of the chest reveals - Non-tender. Auscultation Breath sounds - Normal and Clear.  Cardiovascular Auscultation Rhythm - Regular. Murmurs & Other Heart Sounds - Auscultation of the heart reveals - No Murmurs and No Systolic Clicks.  Abdomen Inspection Inspection of the abdomen reveals - No Visible peristalsis and No Abnormal pulsations. Umbilicus - No Bleeding, No Urine drainage. Palpation/Percussion Palpation and Percussion of the abdomen reveal - Soft, Non Tender, No Rebound tenderness, No Rigidity (guarding) and No Cutaneous hyperesthesia. Note: Abdomen soft. Not severely distended. No distasis recti. No umbilical or other anterior abdominal wall hernias  Female Genitourinary Sexual Maturity Tanner 5 - Adult hair pattern. Note: No vaginal bleeding  nor discharge  Peripheral Vascular Upper Extremity Inspection - Left - No Cyanotic nailbeds - Left, Not Ischemic. Inspection - Right - No Cyanotic nailbeds - Right, Not Ischemic.  Neurologic Neurologic evaluation reveals -normal attention span and ability to concentrate, able to name objects and repeat phrases. Appropriate fund of knowledge ,   normal sensation and normal coordination. Mental Status Affect - not angry, not paranoid. Cranial Nerves-Normal Bilaterally. Gait-Normal.  Neuropsychiatric Mental status exam performed with findings of-able to articulate well with normal speech/language, rate, volume and coherence, thought content normal with ability to perform basic computations and apply abstract reasoning and no evidence of hallucinations, delusions, obsessions or homicidal/suicidal ideation.  Musculoskeletal Global Assessment Spine, Ribs and Pelvis - no instability, subluxation or laxity. Right Upper Extremity - no instability, subluxation or laxity.  Lymphatic Head & Neck  General Head & Neck Lymphatics: Bilateral - Description - No Localized lymphadenopathy. Axillary  General Axillary Region: Bilateral - Description - No Localized lymphadenopathy. Femoral & Inguinal  Generalized Femoral & Inguinal Lymphatics: Left - Description - No Localized lymphadenopathy. Right - Description - No Localized lymphadenopathy.    Assessment & Plan [name] MD; 04/10/2019 11:47 AM)  CHRONIC CHOLECYSTITIS WITH CALCULUS (K80.10) Impression: Story of postprandial nausea vomiting and right-sided abdominal pain radiating to the back classic for biliary colic.  The differential diagnosis seems unlikely.  I think she would benefit from cholecystectomy to break the cycle attacks. She is interested in proceeding  Current Plans You are being scheduled for surgery- Our schedulers will call you.  You should hear from our office's scheduling department within 5 working days  about the location, date, and time of surgery. We try to make accommodations for patient's preferences in scheduling surgery, but sometimes the OR schedule or the surgeon's schedule prevents [place] from making those accommodations.  If you have not heard from our office [phone] in 5 working days, call the office and ask for your surgeon's nurse.  If you have other questions about your diagnosis, plan, or surgery, call the office and ask for your surgeon's nurse.  Written instructions provided Pt Education - Pamphlet Given - Laparoscopic Gallbladder Surgery: discussed with patient and provided information. The anatomy & physiology of hepatobiliary & pancreatic function was discussed. The pathophysiology of gallbladder dysfunction was discussed. Natural history risks without surgery was discussed. I feel the risks of no intervention will lead to serious problems that outweigh the operative risks; therefore, I recommended cholecystectomy to remove the pathology. I explained laparoscopic techniques with possible need for an open approach. Probable cholangiogram to evaluate the bilary tract was explained as well.  Risks such as bleeding, infection, abscess, leak, injury to other organs, need for further treatment, heart attack, death, and other risks were discussed. I noted a good likelihood this will help address the problem. Possibility that this will not correct all abdominal symptoms was explained. Goals of post-operative recovery were discussed as well. We will work to minimize complications. An educational handout further explaining the pathology and treatment options was given as well. Questions were answered. The patient expresses understanding & wishes to proceed with surgery.  Pt Education - CCS Laparosopic Post Op HCI (Deneene Tarver) Pt Education - CCS Good Bowel Health (Rainey Kahrs) Pt Education - Laparoscopic Cholecystectomy: gallbladder  CYSTIC DISEASE OF LIVER (Q44.6) Impression:  Cystic lesions noted in the kidney on ultrasound. Also seen on CT scan in 2014. Both incisions presumed to be benign.  [name], MD, FACS, MASCRS Gastrointestinal and Minimally Invasive Surgery    [address], [address] [place], [place] [address] [phone] Main / Paging [phone] Fax

## 2019-04-18 ENCOUNTER — Other Ambulatory Visit (HOSPITAL_COMMUNITY)
Admission: RE | Admit: 2019-04-18 | Discharge: 2019-04-18 | Disposition: A | Discharge status: Home / Self Care | Payer: PPO | Source: Ambulatory Visit | Attending: Surgery | Admitting: Surgery

## 2019-04-18 DIAGNOSIS — Z01812 Encounter for preprocedural laboratory examination: Secondary | ICD-10-CM | POA: Insufficient documentation

## 2019-04-18 DIAGNOSIS — Z20828 Contact with and (suspected) exposure to other viral communicable diseases: Secondary | ICD-10-CM | POA: Insufficient documentation

## 2019-04-19 LAB — NOVEL CORONAVIRUS, NAA (HOSP ORDER, SEND-OUT TO REF LAB; TAT 18-24 HRS): SARS-CoV-2, NAA: NOT DETECTED

## 2019-04-19 NOTE — Patient Instructions (Addendum)
DUE TO COVID-19 ONLY ONE VISITOR IS ALLOWED TO COME WITH YOU AND STAY IN THE WAITING ROOM ONLY DURING PRE OP AND PROCEDURE DAY OF SURGERY. THE 1 VISITOR MAY VISIT WITH YOU AFTER SURGERY IN YOUR PRIVATE ROOM DURING VISITING HOURS ONLY!  YOU NEED TO HAVE A COVID 19 TEST ON__10/13/2020_____ @____2pm___ , THIS TEST MUST BE DONE BEFORE SURGERY, COME  [address], [place]  , .  [facility]) ONCE YOUR COVID TEST IS COMPLETED, PLEASE BEGIN THE QUARANTINE INSTRUCTIONS AS OUTLINED IN YOUR HANDOUT.                [facility]    Your procedure is scheduled on: 04/21/2019   Report to [facility] Main  Entrance   Report to admitting at 0930 AM     Call this number if you have problems the morning of surgery [phone]    Remember: Do not eat food or drink liquids :After Midnight. BRUSH YOUR TEETH MORNING OF SURGERY AND RINSE YOUR MOUTH OUT, NO CHEWING GUM CANDY OR MINTS.     Take these medicines the morning of surgery with A SIP OF WATER: Zyrtec and Nexium.                                You may not have any metal on your body including hair pins and              piercings  Do not wear jewelry, make-up, lotions, powders or perfumes, deodorant             Do not wear nail polish on your fingernails.  Do not shave  48 hours prior to surgery.              Men may shave face and neck.   Do not bring valuables to the hospital. [facility] IS NOT             RESPONSIBLE   FOR VALUABLES.  Contacts, dentures or bridgework may not be worn into surgery.  Leave suitcase in the car. After surgery it may be brought to your room.     Patients discharged the day of surgery will not be allowed to drive home. IF YOU ARE HAVING SURGERY AND GOING HOME THE SAME DAY, YOU MUST HAVE AN ADULT TO DRIVE YOU HOME AND BE WITH YOU FOR 24 HOURS. YOU MAY GO HOME BY TAXI OR UBER OR ORTHERWISE, BUT AN ADULT MUST ACCOMPANY YOU HOME AND STAY WITH YOU FOR 24 HOURS.  Name and phone  number of your driver:  Special Instructions: N/A [place] - Preparing for Surgery Before surgery, you can play an important role.  Because skin is not sterile, your skin needs to be as free of germs as possible.  You can reduce the number of germs on your skin by washing with CHG (chlorahexidine gluconate) soap before surgery.  CHG is an antiseptic cleaner which kills germs and bonds with the skin to continue killing germs even after washing. Please DO NOT use if you have an allergy to CHG or antibacterial soaps.  If your skin becomes reddened/irritated stop using the CHG and inform your nurse when you arrive at Short Stay. Do not shave (including legs and underarms) for at least 48 hours prior to the first CHG shower.  You may shave your face/neck.  Please follow these instructions carefully:  1.  Shower with CHG Soap  the night before surgery and the  morning of surgery.  2.  If you choose to wash your hair, wash your hair first as usual with your normal  shampoo.  3.  After you shampoo, rinse your hair and body thoroughly to remove the shampoo.                             4.  Use CHG as you would any other liquid soap.  You can apply chg directly to the skin and wash.  Gently with a scrungie or clean washcloth.  5.  Apply the CHG Soap to your body ONLY FROM THE NECK DOWN.   Do   not use on face/ open                           Wound or open sores. Avoid contact with eyes, ears mouth and   genitals (private parts).                       Wash face,  Genitals (private parts) with your normal soap.             6.  Wash thoroughly, paying special attention to the area where your    surgery  will be performed.  7.  Thoroughly rinse your body with warm water from the neck down.  8.  DO NOT shower/wash with your normal soap after using and rinsing off the CHG Soap.                9.  Pat yourself dry with a clean towel.            10.  Wear clean pajamas.            11.  Place clean sheets on your  bed the night of your first shower and do not  sleep with pets. Day of Surgery : Do not apply any lotions/deodorants the morning of surgery.  Please wear clean clothes to the hospital/surgery center.  FAILURE TO FOLLOW THESE INSTRUCTIONS MAY RESULT IN THE CANCELLATION OF YOUR SURGERY  PATIENT SIGNATURE_________________________________  NURSE SIGNATURE__________________________________  ________________________________________________________________________

## 2019-04-20 ENCOUNTER — Encounter (HOSPITAL_COMMUNITY): Payer: Self-pay

## 2019-04-20 ENCOUNTER — Encounter (HOSPITAL_COMMUNITY)
Admission: RE | Admit: 2019-04-20 | Discharge: 2019-04-20 | Disposition: A | Discharge status: Home / Self Care | Payer: PPO | Source: Ambulatory Visit | Attending: Surgery | Admitting: Surgery

## 2019-04-20 ENCOUNTER — Emergency Department (HOSPITAL_COMMUNITY)
Admission: EM | Admit: 2019-04-20 | Discharge: 2019-04-20 | Disposition: A | Discharge status: Home / Self Care | Payer: PPO | Attending: Emergency Medicine | Admitting: Emergency Medicine

## 2019-04-20 ENCOUNTER — Other Ambulatory Visit: Payer: Self-pay

## 2019-04-20 DIAGNOSIS — Z5321 Procedure and treatment not carried out due to patient leaving prior to being seen by health care provider: Secondary | ICD-10-CM | POA: Diagnosis not present

## 2019-04-20 DIAGNOSIS — K8020 Calculus of gallbladder without cholecystitis without obstruction: Secondary | ICD-10-CM | POA: Diagnosis not present

## 2019-04-20 LAB — CBC
HCT: 42.3 % (ref 36.0–46.0)
Hemoglobin: 13.8 g/dL (ref 12.0–15.0)
MCH: 28.9 pg (ref 26.0–34.0)
MCHC: 32.6 g/dL (ref 30.0–36.0)
MCV: 88.5 fL (ref 80.0–100.0)
Platelets: 242 10*3/uL (ref 150–400)
RBC: 4.78 MIL/uL (ref 3.87–5.11)
RDW: 13.1 % (ref 11.5–15.5)
WBC: 6.5 10*3/uL (ref 4.0–10.5)
nRBC: 0 % (ref 0.0–0.2)

## 2019-04-20 MED ORDER — BUPIVACAINE LIPOSOME 1.3 % IJ SUSP
20.0000 mL | Freq: Once | INTRAMUSCULAR | Status: DC
  Filled 2019-04-20: qty 20

## 2019-04-20 NOTE — Progress Notes (Signed)
PCP - Dr. Clinton Gallant Cardiologist - n/a  Chest x-ray - n/a EKG - n/a Stress Test - n/a ECHO - n/a Cardiac Cath - n/a  Sleep Study - n/a CPAP - n/a  Fasting Blood Sugar - n/a Checks Blood Sugar _____ times a day  Blood Thinner Instructions:NONE Aspirin Instructions: Last Dose:  Anesthesia review: No review needed.  Patient denies shortness of breath, fever, cough and chest pain at PAT appointment   Patient verbalized understanding of instructions that were given to them at the PAT appointment. Patient was also instructed that they will need to review over the PAT instructions again at home before surgery.

## 2019-04-20 NOTE — Progress Notes (Signed)
SPOKE W/  _ Debbie     SCREENING SYMPTOMS OF COVID 19: NONE   COUGH--NO  RUNNY NOSE--- NO  SORE THROAT---NO  NASAL CONGESTION----NO  SNEEZING----NO  SHORTNESS OF BREATH---NO  DIFFICULTY BREATHING---NO  TEMP >100.0 -----NO  UNEXPLAINED BODY ACHES------NO  CHILLS --------NO   HEADACHES ---------NO  LOSS OF SMELL/ TASTE --------NO    HAVE YOU OR ANY FAMILY MEMBER TRAVELLED PAST 14 DAYS OUT OF THE   COUNTY---YES STATE----YES COUNTRY----NO  HAVE YOU OR ANY FAMILY MEMBER BEEN EXPOSED TO ANYONE WITH COVID 19? NO

## 2019-04-20 NOTE — ED Triage Notes (Signed)
Pt coming from home c/o pain due to gallstones that started a few hours ago. Pt scheduled to have surgery to remove them at 11:30 tomorrow morning but said pain was unbearable tonight. 2 episodes of emesis.

## 2019-04-20 NOTE — ED Notes (Signed)
Pt is leaving due to wait and said pain has subsided.

## 2019-04-21 ENCOUNTER — Ambulatory Visit (HOSPITAL_COMMUNITY): Payer: PPO | Admitting: Certified Registered Nurse Anesthetist

## 2019-04-21 ENCOUNTER — Ambulatory Visit (HOSPITAL_COMMUNITY)
Admission: RE | Admit: 2019-04-21 | Discharge: 2019-04-21 | Disposition: A | Discharge status: Home / Self Care | Payer: PPO | Attending: Surgery | Admitting: Surgery

## 2019-04-21 ENCOUNTER — Other Ambulatory Visit: Payer: Self-pay

## 2019-04-21 ENCOUNTER — Encounter (HOSPITAL_COMMUNITY): Payer: Self-pay | Admitting: Certified Registered Nurse Anesthetist

## 2019-04-21 ENCOUNTER — Ambulatory Visit (HOSPITAL_COMMUNITY): Payer: PPO

## 2019-04-21 ENCOUNTER — Encounter (HOSPITAL_COMMUNITY)
Admission: RE | Disposition: A | Discharge status: Home / Self Care | Payer: Self-pay | Source: Home / Self Care | Attending: Surgery

## 2019-04-21 DIAGNOSIS — K219 Gastro-esophageal reflux disease without esophagitis: Secondary | ICD-10-CM | POA: Diagnosis not present

## 2019-04-21 DIAGNOSIS — K8012 Calculus of gallbladder with acute and chronic cholecystitis without obstruction: Secondary | ICD-10-CM | POA: Insufficient documentation

## 2019-04-21 DIAGNOSIS — Z87442 Personal history of urinary calculi: Secondary | ICD-10-CM | POA: Diagnosis not present

## 2019-04-21 DIAGNOSIS — Z87891 Personal history of nicotine dependence: Secondary | ICD-10-CM | POA: Diagnosis not present

## 2019-04-21 DIAGNOSIS — Z79899 Other long term (current) drug therapy: Secondary | ICD-10-CM | POA: Insufficient documentation

## 2019-04-21 DIAGNOSIS — Z9049 Acquired absence of other specified parts of digestive tract: Secondary | ICD-10-CM | POA: Diagnosis not present

## 2019-04-21 DIAGNOSIS — K812 Acute cholecystitis with chronic cholecystitis: Secondary | ICD-10-CM | POA: Diagnosis present

## 2019-04-21 DIAGNOSIS — K8010 Calculus of gallbladder with chronic cholecystitis without obstruction: Secondary | ICD-10-CM | POA: Diagnosis not present

## 2019-04-21 DIAGNOSIS — Z419 Encounter for procedure for purposes other than remedying health state, unspecified: Secondary | ICD-10-CM

## 2019-04-21 DIAGNOSIS — K8046 Calculus of bile duct with acute and chronic cholecystitis without obstruction: Secondary | ICD-10-CM | POA: Diagnosis not present

## 2019-04-21 HISTORY — PX: LAPAROSCOPIC CHOLECYSTECTOMY SINGLE SITE WITH INTRAOPERATIVE CHOLANGIOGRAM: SHX6538

## 2019-04-21 SURGERY — LAPAROSCOPIC CHOLECYSTECTOMY SINGLE SITE WITH INTRAOPERATIVE CHOLANGIOGRAM
Anesthesia: General

## 2019-04-21 MED ORDER — METRONIDAZOLE IN NACL 5-0.79 MG/ML-% IV SOLN
INTRAVENOUS | Status: AC
  Filled 2019-04-21: qty 100

## 2019-04-21 MED ORDER — DEXAMETHASONE SODIUM PHOSPHATE 10 MG/ML IJ SOLN
INTRAMUSCULAR | Status: DC | PRN
  Administered 2019-04-21: 10 mg via INTRAVENOUS

## 2019-04-21 MED ORDER — ACETAMINOPHEN 500 MG PO TABS
1000.0000 mg | ORAL_TABLET | ORAL | Status: DC
  Filled 2019-04-21: qty 2

## 2019-04-21 MED ORDER — METRONIDAZOLE IN NACL 5-0.79 MG/ML-% IV SOLN
INTRAVENOUS | Status: DC | PRN
  Administered 2019-04-21: 500 mg via INTRAVENOUS

## 2019-04-21 MED ORDER — ONDANSETRON HCL 4 MG/2ML IJ SOLN
4.0000 mg | Freq: Four times a day (QID) | INTRAMUSCULAR | Status: AC | PRN
  Administered 2019-04-21: 4 mg via INTRAVENOUS

## 2019-04-21 MED ORDER — SODIUM CHLORIDE 0.9 % IV SOLN
INTRAVENOUS | Status: AC
  Filled 2019-04-21: qty 20

## 2019-04-21 MED ORDER — BUPIVACAINE LIPOSOME 1.3 % IJ SUSP
INTRAMUSCULAR | Status: DC | PRN
  Administered 2019-04-21: 20 mL

## 2019-04-21 MED ORDER — ONDANSETRON HCL 4 MG/2ML IJ SOLN
INTRAMUSCULAR | Status: DC | PRN
  Administered 2019-04-21: 4 mg via INTRAVENOUS

## 2019-04-21 MED ORDER — FENTANYL CITRATE (PF) 100 MCG/2ML IJ SOLN
INTRAMUSCULAR | Status: AC
  Filled 2019-04-21: qty 2

## 2019-04-21 MED ORDER — LIDOCAINE 2% (20 MG/ML) 5 ML SYRINGE
INTRAMUSCULAR | Status: AC
  Filled 2019-04-21: qty 5

## 2019-04-21 MED ORDER — LACTATED RINGERS IR SOLN
Status: DC | PRN
  Administered 2019-04-21: 1000 mL

## 2019-04-21 MED ORDER — PROMETHAZINE HCL 25 MG/ML IJ SOLN: 6.2500 mg | Freq: Once | INTRAMUSCULAR | Status: DC

## 2019-04-21 MED ORDER — ONDANSETRON HCL 4 MG/2ML IJ SOLN
INTRAMUSCULAR | Status: AC
  Filled 2019-04-21: qty 2

## 2019-04-21 MED ORDER — BUPIVACAINE HCL (PF) 0.25 % IJ SOLN
INTRAMUSCULAR | Status: DC | PRN
  Administered 2019-04-21: 30 mL

## 2019-04-21 MED ORDER — ALBUMIN HUMAN 5 % IV SOLN
INTRAVENOUS | Status: AC
  Filled 2019-04-21: qty 250

## 2019-04-21 MED ORDER — FENTANYL CITRATE (PF) 100 MCG/2ML IJ SOLN
INTRAMUSCULAR | Status: DC | PRN
  Administered 2019-04-21 (×3): 50 ug via INTRAVENOUS

## 2019-04-21 MED ORDER — CHLORHEXIDINE GLUCONATE CLOTH 2 % EX PADS: 6.0000 | MEDICATED_PAD | Freq: Once | CUTANEOUS | Status: DC

## 2019-04-21 MED ORDER — ONDANSETRON HCL 4 MG PO TABS: 4.0000 mg | ORAL_TABLET | Freq: Three times a day (TID) | ORAL | 5 refills | Status: DC | PRN

## 2019-04-21 MED ORDER — ACETAMINOPHEN 160 MG/5ML PO SOLN
1000.0000 mg | ORAL | Status: AC
  Administered 2019-04-21: 1000 mg via ORAL
  Filled 2019-04-21: qty 40.6

## 2019-04-21 MED ORDER — LACTATED RINGERS IV SOLN
INTRAVENOUS | Status: DC
  Administered 2019-04-21: 10:00:00 via INTRAVENOUS

## 2019-04-21 MED ORDER — LIDOCAINE HCL 2 % IJ SOLN
INTRAMUSCULAR | Status: AC
  Filled 2019-04-21: qty 40

## 2019-04-21 MED ORDER — FENTANYL CITRATE (PF) 100 MCG/2ML IJ SOLN
25.0000 ug | INTRAMUSCULAR | Status: DC | PRN
  Administered 2019-04-21: 50 ug via INTRAVENOUS

## 2019-04-21 MED ORDER — ROCURONIUM BROMIDE 100 MG/10ML IV SOLN
INTRAVENOUS | Status: DC | PRN
  Administered 2019-04-21: 60 mg via INTRAVENOUS

## 2019-04-21 MED ORDER — OXYCODONE HCL 5 MG/5ML PO SOLN: 5.0000 mg | Freq: Once | ORAL | Status: DC | PRN

## 2019-04-21 MED ORDER — DEXTROSE 5 % IV SOLN: 2.0000 g | Freq: Once | INTRAVENOUS | Status: DC

## 2019-04-21 MED ORDER — TRAMADOL HCL 50 MG PO TABS: 50.0000 mg | ORAL_TABLET | Freq: Four times a day (QID) | ORAL | 0 refills | Status: DC | PRN

## 2019-04-21 MED ORDER — SUGAMMADEX SODIUM 200 MG/2ML IV SOLN
INTRAVENOUS | Status: DC | PRN
  Administered 2019-04-21: 200 mg via INTRAVENOUS

## 2019-04-21 MED ORDER — GABAPENTIN 250 MG/5ML PO SOLN
300.0000 mg | ORAL | Status: AC
  Administered 2019-04-21: 300 mg via ORAL
  Filled 2019-04-21: qty 6

## 2019-04-21 MED ORDER — PROPOFOL 10 MG/ML IV BOLUS
INTRAVENOUS | Status: AC
  Filled 2019-04-21: qty 20

## 2019-04-21 MED ORDER — LIDOCAINE HCL (CARDIAC) PF 100 MG/5ML IV SOSY
PREFILLED_SYRINGE | INTRAVENOUS | Status: DC | PRN
  Administered 2019-04-21: 40 mg via INTRAVENOUS

## 2019-04-21 MED ORDER — PROMETHAZINE HCL 25 MG/ML IJ SOLN
INTRAMUSCULAR | Status: AC
  Filled 2019-04-21: qty 1

## 2019-04-21 MED ORDER — BUPIVACAINE HCL (PF) 0.25 % IJ SOLN
INTRAMUSCULAR | Status: AC
  Filled 2019-04-21: qty 60

## 2019-04-21 MED ORDER — IOHEXOL 300 MG/ML  SOLN
INTRAMUSCULAR | Status: DC | PRN
  Administered 2019-04-21: 6 mL

## 2019-04-21 MED ORDER — ROCURONIUM BROMIDE 10 MG/ML (PF) SYRINGE
PREFILLED_SYRINGE | INTRAVENOUS | Status: AC
  Filled 2019-04-21: qty 10

## 2019-04-21 MED ORDER — OXYCODONE HCL 5 MG PO TABS: 5.0000 mg | ORAL_TABLET | Freq: Once | ORAL | Status: DC | PRN

## 2019-04-21 MED ORDER — DEXAMETHASONE SODIUM PHOSPHATE 10 MG/ML IJ SOLN
INTRAMUSCULAR | Status: AC
  Filled 2019-04-21: qty 1

## 2019-04-21 MED ORDER — GABAPENTIN 300 MG PO CAPS
300.0000 mg | ORAL_CAPSULE | ORAL | Status: DC
  Filled 2019-04-21: qty 1

## 2019-04-21 MED ORDER — SODIUM CHLORIDE 0.9 % IV SOLN
2.0000 g | Freq: Once | INTRAVENOUS | Status: AC
  Administered 2019-04-21: 2 g via INTRAVENOUS

## 2019-04-21 MED ORDER — PROPOFOL 10 MG/ML IV BOLUS
INTRAVENOUS | Status: DC | PRN
  Administered 2019-04-21: 100 mg via INTRAVENOUS

## 2019-04-21 MED ORDER — PHENYLEPHRINE 40 MCG/ML (10ML) SYRINGE FOR IV PUSH (FOR BLOOD PRESSURE SUPPORT)
PREFILLED_SYRINGE | INTRAVENOUS | Status: AC
  Filled 2019-04-21: qty 10

## 2019-04-21 SURGICAL SUPPLY — 41 items
APPLIER CLIP 5 13 M/L LIGAMAX5 (MISCELLANEOUS) ×3
CABLE HIGH FREQUENCY MONO STRZ (ELECTRODE) ×3 IMPLANT
CHLORAPREP W/TINT 26 (MISCELLANEOUS) ×3 IMPLANT
CLIP APPLIE 5 13 M/L LIGAMAX5 (MISCELLANEOUS) ×1 IMPLANT
COVER MAYO STAND STRL (DRAPES) ×3 IMPLANT
COVER SURGICAL LIGHT HANDLE (MISCELLANEOUS) ×3 IMPLANT
COVER WAND RF STERILE (DRAPES) IMPLANT
DECANTER SPIKE VIAL GLASS SM (MISCELLANEOUS) ×3 IMPLANT
DRAIN CHANNEL 19F RND (DRAIN) IMPLANT
DRAPE C-ARM 42X120 X-RAY (DRAPES) ×3 IMPLANT
DRAPE WARM FLUID 44X44 (DRAPES) ×3 IMPLANT
DRSG TEGADERM 4X4.75 (GAUZE/BANDAGES/DRESSINGS) ×3 IMPLANT
ELECT REM PT RETURN 15FT ADLT (MISCELLANEOUS) ×3 IMPLANT
ENDOLOOP SUT PDS II  0 18 (SUTURE) ×2
ENDOLOOP SUT PDS II 0 18 (SUTURE) ×1 IMPLANT
EVACUATOR SILICONE 100CC (DRAIN) IMPLANT
GAUZE SPONGE 2X2 8PLY STRL LF (GAUZE/BANDAGES/DRESSINGS) ×1 IMPLANT
GLOVE ECLIPSE 8.0 STRL XLNG CF (GLOVE) ×27 IMPLANT
GLOVE INDICATOR 8.0 STRL GRN (GLOVE) ×3 IMPLANT
GOWN STRL REUS W/TWL XL LVL3 (GOWN DISPOSABLE) ×12 IMPLANT
IRRIG SUCT STRYKERFLOW 2 WTIP (MISCELLANEOUS) ×3
IRRIGATION SUCT STRKRFLW 2 WTP (MISCELLANEOUS) ×1 IMPLANT
KIT BASIN OR (CUSTOM PROCEDURE TRAY) ×3 IMPLANT
KIT TURNOVER KIT A (KITS) ×3 IMPLANT
PAD POSITIONING PINK XL (MISCELLANEOUS) ×3 IMPLANT
POUCH RETRIEVAL ECOSAC 10 (ENDOMECHANICALS) IMPLANT
POUCH RETRIEVAL ECOSAC 10MM (ENDOMECHANICALS)
PROTECTOR NERVE ULNAR (MISCELLANEOUS) IMPLANT
SCISSORS LAP 5X35 DISP (ENDOMECHANICALS) ×3 IMPLANT
SET CHOLANGIOGRAPH MIX (MISCELLANEOUS) ×3 IMPLANT
SET TUBE SMOKE EVAC HIGH FLOW (TUBING) ×3 IMPLANT
SHEARS HARMONIC ACE PLUS 36CM (ENDOMECHANICALS) ×3 IMPLANT
SPONGE GAUZE 2X2 STER 10/PKG (GAUZE/BANDAGES/DRESSINGS) ×2
SUT MNCRL AB 4-0 PS2 18 (SUTURE) ×3 IMPLANT
SUT PDS AB 1 CT1 27 (SUTURE) ×9 IMPLANT
SYR 20ML LL LF (SYRINGE) ×3 IMPLANT
TOWEL OR 17X26 10 PK STRL BLUE (TOWEL DISPOSABLE) ×3 IMPLANT
TOWEL OR NON WOVEN STRL DISP B (DISPOSABLE) ×3 IMPLANT
TRAY LAPAROSCOPIC (CUSTOM PROCEDURE TRAY) ×3 IMPLANT
TROCAR BLADELESS OPT 5 100 (ENDOMECHANICALS) ×3 IMPLANT
TROCAR BLADELESS OPT 5 150 (ENDOMECHANICALS) ×3 IMPLANT

## 2019-04-21 NOTE — Op Note (Signed)
04/21/2019  PATIENT:  Regina Walters  73 y.o. female  Patient Care Team: Joycelyn Rua, MD as PCP - General (Family Medicine) Joycelyn Rua, MD (Family Medicine) Karie Soda, MD as Consulting Physician (General Surgery)  PRE-OPERATIVE DIAGNOSIS:    Chronic Calculus cholecystitis  POST-OPERATIVE DIAGNOSIS:   Acute on Chronic Calculus Cholecystitis  PROCEDURE:  SINGLE SITE Laparoscopic cholecystectomy with intraoperative cholangiogram  SURGEON:  [name], MD, FACS.  ASSISTANT: OR Staff   ANESTHESIA:    General with endotracheal intubation Local anesthetic as a field block  EBL:  (See Anesthesia Intraoperative Record) No intake/output data recorded.  Delay start of Pharmacological VTE agent (>24hrs) due to surgical blood loss or risk of bleeding:  no  DRAINS: None   SPECIMEN: Gallbladder    DISPOSITION OF SPECIMEN:  PATHOLOGY  COUNTS:  YES  PLAN OF CARE: Discharge to home after PACU  PATIENT DISPOSITION:  PACU - hemodynamically stable.  INDICATION: Pleasant woman with intermittent episodes of biliary colic and stones.  I thought she would benefit from cholecystectomy since the rest of the differential diagnosis seems unlikely.  She actually another attack last night with emesis and came emergency department.  However due to the long wait she decided to come home since her pain was less.  She is mildly uncomfortable this morning but not severely so.  The anatomy & physiology of hepatobiliary & pancreatic function was discussed.  The pathophysiology of gallbladder dysfunction was discussed.  Natural history risks without surgery was discussed.   I feel the risks of no intervention will lead to serious problems that outweigh the operative risks; therefore, I recommended cholecystectomy to remove the pathology.  I explained laparoscopic techniques with possible need for an open approach.  Probable cholangiogram to evaluate the bilary tract was explained as  well.    Risks such as bleeding, infection, abscess, leak, injury to other organs, need for further treatment, heart attack, death, and other risks were discussed.  I noted a good likelihood this will help address the problem.  Possibility that this will not correct all abdominal symptoms was explained.  Goals of post-operative recovery were discussed as well.  We will work to minimize complications.  An educational handout further explaining the pathology and treatment options was given as well.  Questions were answered.  The patient expresses understanding & wishes to proceed with surgery.  OR FINDINGS: Chron adhesions to the gallbladder consistent with chronic cholecystitis.  Acute inflammation and edema consistent with acute cholecystitis as well.  Moderate adhesions of omentum in upper abdomen.  Liver: normal.  No liver biopsy needed  DESCRIPTION:   The patient was identified & brought in the operating room. The patient was positioned supine with arms tucked. SCDs were active during the entire case. The patient underwent general anesthesia without any difficulty.  The abdomen was prepped and draped in a sterile fashion. A Surgical Timeout confirmed our plan.  I made a transverse curvilinear incision through the superior umbilical fold.  I placed a 39mm long port through the supraumbilical fascia using a modified Hassan cutdown technique with umbilical stalk fascial countertraction. I began carbon dioxide insufflation.  No change in end tidal CO2 measurement.   Camera inspection revealed no injury. There were some adhesions of omentum to the falciform ligament and right upper quadrant.  I was able to free greater omentum off with the camera to allow enough working space to place ports and continue single site technique. I placed a #5 port in left upper  aspect of the wound. I placed a 5 mm atraumatic grasper in the right inferior aspect of the wound.  I turned attention to the right upper quadrant.   Gallbladder was enlarged and edematous with some greater omental adhesions to it.  I carefully freed greater omental adhesions off the falciform ligament as well as the right upper quadrant anterior abdominal wall.  Was able to free enough to expose the gallbladder dome.  I did circumferential inspection of the abdomen again and saw no evidence of any injury or other abnormality.  Hemostasis good on the omentum.  I turned attention to the right upper quadrant.  The gallbladder fundus was elevated cephalad. I freed adhesions to the ventral surface of the gallbladder off carefully.  I freed the peritoneal coverings between the gallbladder and the liver on the posteriolateral and anteriomedial walls. I alternated between Harmonic & blunt Maryland dissection to help get a good critical view of the cystic artery and cystic duct.  I did further dissection to free 80% of the gallbladder off the liver bed to get a good critical view of the infundibulum and cystic duct. I dissected out the cystic artery; and, after getting a good 360 view, ligated the anterior & posterior branches of the cystic artery close on the infundibulum using the Harmonic ultrasonic dissection. I skeletonized the cystic duct.  I freed the gallbladder for its last remaining attachments on the liver to help straighten off and provide better elevation and exposure of the infundibulum and cystic duct.  I skeletonized the cystic duct to have good exposure.    I placed a clip on the infundibulum. I did a partial cystic duct-otomy and ensured patency. I placed a 5 [place] cholangiocatheter through a puncture site at the right subcostal ridge of the abdominal wall and directed it into the cystic duct.  We ran a cholangiogram with dilute radio-opaque contrast and continuous fluoroscopy. Contrast flowed from a side branch consistent with cystic duct cannulization. Contrast flowed up the common hepatic duct into the right and left intrahepatic chains out to  secondary radicals. Contrast flowed down the common bile duct easily across the normal ampulla into the duodenum.  This was consistent with a normal cholangiogram.  I removed the cholangiocatheter.  Because the cystic duct was somewhat inflamed and enlarged I decided to ligate it using a 0 PDS Endoloop which I lasted around entire gallbladder and then came down to ligate at the cystic duct/common bile duct junction to good result.  I placed clips on the cystic duct x4 more distally towards the infundibulum.  I completed cystic duct transection.  Placed the gallbladder inside and eco-sac.  I did irrigation and meticulous inspection of the right upper quadrant and the rest of the abdomen.  There was good hemostasis on the gallbladder fossa of the liver, greater omentum and elsewhere.  Transverse colon duodenal sweep small bowel and other abdominal contents normal and uninjured.  I inspected the rest of the abdomen & detected no injury nor bleeding elsewhere.  I removed the gallbladder out the supraumbilical fascia. I closed the fascia transversely using  #1 PDS interrupted stitches. I closed the skin using 4-0 monocryl stitch.  Sterile dressing was applied. The patient was extubated & arrived in the PACU in stable condition.  I had discussed postoperative care with the patient in the holding area. I discussed operative findings, updated the patient's status, discussed probable steps to recovery, and gave postoperative recommendations to the patient's spouse.  Recommendations were made.  Questions were answered.  He expressed understanding & appreciation.  [name], M.D., F.A.C.S. Gastrointestinal and Minimally Invasive Surgery [facility] [address], [place], [place] [id number] [phone] Main / Paging  04/21/2019 2:42 PM

## 2019-04-21 NOTE — Discharge Instructions (Signed)
LAPAROSCOPIC SURGERY: POST OP INSTRUCTIONS  ######################################################################  EAT Gradually transition to a high fiber diet with a fiber supplement over the next few weeks after discharge.  Start with a pureed / full liquid diet (see below)  WALK Walk an hour a day.  Control your pain to do that.    CONTROL PAIN Control pain so that you can walk, sleep, tolerate sneezing/coughing, go up/down stairs.  HAVE A BOWEL MOVEMENT DAILY Keep your bowels regular to avoid problems.  OK to try a laxative to override constipation.  OK to use an antidairrheal to slow down diarrhea.  Call if not better after 2 tries  CALL IF YOU HAVE PROBLEMS/CONCERNS Call if you are still struggling despite following these instructions. Call if you have concerns not answered by these instructions  ######################################################################    1. DIET: Follow a light bland diet & liquids the first 24 hours after arrival home, such as soup, liquids, starches, etc.  Be sure to drink plenty of fluids.  Quickly advance to a usual solid diet within a few days.  Avoid fast food or heavy meals as your are more likely to get nauseated or have irregular bowels.  A low-fat, high-fiber diet for the rest of your life is ideal.  2. Take your usually prescribed home medications unless otherwise directed.  3. PAIN CONTROL: a. Pain is best controlled by a usual combination of three different methods TOGETHER: i. Ice/Heat ii. Over the counter pain medication iii. Prescription pain medication b. Most patients will experience some swelling and bruising around the incisions.  Ice packs or heating pads (30-60 minutes up to 6 times a day) will help. Use ice for the first few days to help decrease swelling and bruising, then switch to heat to help relax tight/sore spots and speed recovery.  Some people prefer to use ice alone, heat alone, alternating between ice & heat.   Experiment to what works for you.  Swelling and bruising can take several weeks to resolve.   c. It is helpful to take an over-the-counter pain medication regularly for the first few weeks.  Choose one of the following that works best for you: i. Naproxen (Aleve, etc)  Two 220mg tabs twice a day ii. Ibuprofen (Advil, etc) Three 200mg tabs four times a day (every meal & bedtime) iii. Acetaminophen (Tylenol, etc) 500-650mg four times a day (every meal & bedtime) d. A  prescription for pain medication (such as oxycodone, hydrocodone, tramadol, gabapentin, methocarbamol, etc) should be given to you upon discharge.  Take your pain medication as prescribed.  i. If you are having problems/concerns with the prescription medicine (does not control pain, nausea, vomiting, rash, itching, etc), please call us [phone] to see if we need to switch you to a different pain medicine that will work better for you and/or control your side effect better. ii. If you need a refill on your pain medication, please give us 48 hour notice.  contact your pharmacy.  They will contact our office to request authorization. Prescriptions will not be filled after 5 pm or on week-ends  4. Avoid getting constipated.   a. Between the surgery and the pain medications, it is common to experience some constipation.   b. Increasing fluid intake and taking a fiber supplement (such as Metamucil, Citrucel, FiberCon, MiraLax, etc) 1-2 times a day regularly will usually help prevent this problem from occurring.   c. A mild laxative (prune juice, Milk of Magnesia, MiraLax, etc) should be taken according to   package directions if there are no bowel movements after 48 hours.   5. Watch out for diarrhea.   a. If you have many loose bowel movements, simplify your diet to bland foods & liquids for a few days.   b. Stop any stool softeners and decrease your fiber supplement.   c. Switching to mild anti-diarrheal medications (Kayopectate, Pepto  Bismol) can help.   d. If this worsens or does not improve, please call us.  6. Wash / shower every day.  You may shower over the dressings as they are waterproof.  Continue to shower over incision(s) after the dressing is off.  7. Remove your waterproof bandages 5 days after surgery.  You may leave the incision open to air.  You may replace a dressing/Band-Aid to cover the incision for comfort if you wish.   8. ACTIVITIES as tolerated:   a. You may resume regular (light) daily activities beginning the next day--such as daily self-care, walking, climbing stairs--gradually increasing activities as tolerated.  If you can walk 30 minutes without difficulty, it is safe to try more intense activity such as jogging, treadmill, bicycling, low-impact aerobics, swimming, etc. b. Save the most intensive and strenuous activity for last such as sit-ups, heavy lifting, contact sports, etc  Refrain from any heavy lifting or straining until you are off narcotics for pain control.   c. DO NOT PUSH THROUGH PAIN.  Let pain be your guide: If it hurts to do something, don't do it.  Pain is your body warning you to avoid that activity for another week until the pain goes down. d. You may drive when you are no longer taking prescription pain medication, you can comfortably wear a seatbelt, and you can safely maneuver your car and apply brakes. e. You may have sexual intercourse when it is comfortable.  9. FOLLOW UP in our office a. Please call [facility] at [phone] to set up an appointment to see your surgeon in the office for a follow-up appointment approximately 2-3 weeks after your surgery. b. Make sure that you call for this appointment the day you arrive home to insure a convenient appointment time.  10. IF YOU HAVE DISABILITY OR FAMILY LEAVE FORMS, BRING THEM TO THE OFFICE FOR PROCESSING.  DO NOT GIVE THEM TO YOUR DOCTOR.   WHEN TO CALL US [phone]: 1. Poor pain control 2. Reactions / problems with new  medications (rash/itching, nausea, etc)  3. Fever over 101.5 F (38.5 C) 4. Inability to urinate 5. Nausea and/or vomiting 6. Worsening swelling or bruising 7. Continued bleeding from incision. 8. Increased pain, redness, or drainage from the incision   The clinic staff is available to answer your questions during regular business hours (8:30am-5pm).  Please don't hesitate to call and ask to speak to one of our nurses for clinical concerns.   If you have a medical emergency, go to the nearest emergency room or call 911.  A surgeon from [facility] is always on call at the hospitals   [facility] [address], [address], [place], [place]  [address] ? MAIN: [phone] ? TOLL FREE: [phone] ?  FAX [phone] [email or web address]    Cholecystitis  Cholecystitis is inflammation of the gallbladder. It is often called a gallbladder attack. The gallbladder is a pear-shaped organ that lies beneath the liver on the right side of the body. The gallbladder stores bile, which is a fluid that helps the body digest fats. If bile builds   up in your gallbladder, your gallbladder becomes inflamed. This condition may occur suddenly. Cholecystitis is a serious condition and requires treatment. What are the causes? The most common cause of this condition is gallstones. Gallstones can block the tube (duct) that carries bile out of your gallbladder. This causes bile to build up. Other causes include:  Damage to the gallbladder due to a decrease in blood flow.  Infections in the bile ducts.  Scars or kinks in the bile ducts.  Tumors in the liver, pancreas, or gallbladder. What increases the risk? You are more likely to develop this condition if:  You have sickle cell disease.  You take birth control pills or use estrogen.  You have alcoholic liver disease.  You have liver cirrhosis.  You have your nutrition delivered through a vein  (parenteral nutrition).  You are critically ill.  You do not eat or drink for a long time. This is also called "fasting."  You are obese.  You lose weight too fast.  You are pregnant.  You have high levels of fat (triglycerides) in the blood.  You have pancreatitis. What are the signs or symptoms? Symptoms of this condition include:  Pain in the abdomen, especially in the upper right area of the abdomen.  Tenderness or bloating in the abdomen.  Nausea.  Vomiting.  Fever.  Chills. How is this diagnosed? This condition is diagnosed with a medical history and physical exam. You may also have other tests, including:  Imaging tests, such as: ? An ultrasound of the gallbladder. ? A CT scan of the abdomen. ? A gallbladder nuclear scan (HIDA scan). This scan allows your health care provider to see the bile moving from your liver to your gallbladder and on to your small intestine. ? MRI.  Blood tests, such as: ? A complete blood count. The white blood cell count may be higher than normal. ? Liver function tests. Certain types of gallstones cause some results to be higher than normal. How is this treated? Treatment may include:  Surgery to remove your gallbladder (cholecystectomy).  Antibiotic medicine, usually through an IV.  Fasting for a certain amount of time.  Giving IV fluids.  Medicine to treat pain or vomiting. Follow these instructions at home:  If you had surgery, follow instructions from your health care provider about home care after the procedure. Medicines   Take over-the-counter and prescription medicines only as told by your health care provider.  If you were prescribed an antibiotic medicine, take it as told by your health care provider. Do not stop taking the antibiotic even if you start to feel better. General instructions  Follow instructions from your health care provider about what to eat or drink. When you are allowed to eat, avoid eating  or drinking anything that triggers your symptoms.  Do not lift anything that is heavier than 10 lb (4.5 kg), or the limit that you are told, until your health care provider says that it is safe.  Do not use any products that contain nicotine or tobacco, such as cigarettes and e-cigarettes. If you need help quitting, ask your health care provider.  Keep all follow-up visits as told by your health care provider. This is important. Contact a health care provider if:  Your pain is not controlled with medicine.  You have a fever. Get help right away if:  Your pain moves to another part of your abdomen or to your back.  You continue to have symptoms or you develop new   symptoms even with treatment. Summary  Cholecystitis is inflammation of the gallbladder.  The most common cause of this condition is gallstones. Gallstones can block the tube (duct) that carries bile out of your gallbladder.  Common symptoms are pain in the abdomen, nausea, vomiting, fever, and chills.  This condition is treated with surgery to remove the gallbladder, medicines, fasting, and IV fluids.  Follow your health care provider's instructions for eating and drinking. Avoid eating anything that triggers your symptoms. This information is not intended to replace advice given to you by your health care provider. Make sure you discuss any questions you have with your health care provider. Document Released: 06/22/2005 Document Revised: 10/29/2017 Document Reviewed: 10/29/2017 Elsevier Patient Education  2020 [employer]vier Inc[employer]  

## 2019-04-21 NOTE — Anesthesia Preprocedure Evaluation (Signed)
Anesthesia Evaluation  Patient identified by MRN, date of birth, ID band Patient awake    Reviewed: Allergy & Precautions, H&P , NPO status , Patient's Chart, lab work & pertinent test results  Airway Mallampati: II   Neck ROM: full    Dental   Pulmonary neg pulmonary ROS,    breath sounds clear to auscultation       Cardiovascular negative cardio ROS   Rhythm:regular Rate:Normal     Neuro/Psych    GI/Hepatic GERD  ,  Endo/Other    Renal/GU      Musculoskeletal   Abdominal   Peds  Hematology   Anesthesia Other Findings   Reproductive/Obstetrics                             Anesthesia Physical Anesthesia Plan  ASA: II  Anesthesia Plan: General   Post-op Pain Management:    Induction: Intravenous  PONV Risk Score and Plan: 3 and Ondansetron, Dexamethasone and Treatment may vary due to age or medical condition  Airway Management Planned: Oral ETT  Additional Equipment:   Intra-op Plan:   Post-operative Plan: Extubation in OR  Informed Consent: I have reviewed the patients History and Physical, chart, labs and discussed the procedure including the risks, benefits and alternatives for the proposed anesthesia with the patient or authorized representative who has indicated his/her understanding and acceptance.       Plan Discussed with: CRNA, Anesthesiologist and Surgeon  Anesthesia Plan Comments:         Anesthesia Quick Evaluation

## 2019-04-21 NOTE — Anesthesia Postprocedure Evaluation (Signed)
Anesthesia Post Note  Patient: ZHANNA MELIN  Procedure(s) Performed: LAPAROSCOPIC CHOLECYSTECTOMY SINGLE SITE WITH INTRAOPERATIVE CHOLANGIOGRAM (N/A )     Patient location during evaluation: PACU Anesthesia Type: General Level of consciousness: awake and alert Pain management: pain level controlled Vital Signs Assessment: post-procedure vital signs reviewed and stable Respiratory status: spontaneous breathing, nonlabored ventilation and respiratory function stable Cardiovascular status: blood pressure returned to baseline and stable Postop Assessment: no apparent nausea or vomiting Anesthetic complications: no    Last Vitals:  Vitals:   04/21/19 1630 04/21/19 1652  BP:  136/71  Pulse:  78  Resp:  14  Temp: 36.6 C 36.6 C  SpO2:  93%    Last Pain:  Vitals:   04/21/19 1630  TempSrc:   PainSc: 2                  Lynda Rainwater

## 2019-04-21 NOTE — Interval H&P Note (Signed)
History and Physical Interval Note:  04/21/2019 12:17 PM  Regina Walters  has presented today for surgery, with the diagnosis of SYMPTOMATIC BILIARY COLIC, PROBABLE CHRONIC CHOLECYSTITIS.  The various methods of treatment have been discussed with the patient and family. After consideration of risks, benefits and other options for treatment, the patient has consented to  Procedure(s): SINGLE SITE LIVER BIOPSY, SINGLE SITE POSSIBLE NEEDLE CORE BIOPSY OF LIVER (N/A) LAPAROSCOPIC CHOLECYSTECTOMY SINGLE SITE WITH INTRAOPERATIVE CHOLANGIOGRAM (N/A) as a surgical intervention.  The patient's history has been reviewed, patient examined, no change in status, stable for surgery.  I have reviewed the patient's chart and labs.  Questions were answered to the patient's satisfaction.    I have re-reviewed the the patient's records, history, medications, and allergies.  I have re-examined the patient.  I again discussed intraoperative plans and goals of post-operative recovery.  The patient agrees to proceed.  Regina MartinDeborah L Brouillet  June 06, 1946 [id number]  Patient Care Team: Joycelyn RuaMeyers, Stephen, MD as PCP - General (Family Medicine) Joycelyn RuaMeyers, Stephen, MD (Family Medicine) Karie SodaGross, Safari Cinque, MD as Consulting Physician (General Surgery)  Patient Active Problem List   Diagnosis Date Noted  . Chronic cholecystitis with calculus 04/10/2019  . GERD (gastroesophageal reflux disease) 04/10/2019  . Liver cyst 04/09/2013    Past Medical History:  Diagnosis Date  . Kidney stone on left side 2014    Past Surgical History:  Procedure Laterality Date  . TONSILLECTOMY      Social History   Socioeconomic History  . Marital status: Married    Spouse name: Not on file  . Number of children: Not on file  . Years of education: Not on file  . Highest education level: Not on file  Occupational History  . Not on file  Social Needs  . Financial resource strain: Not on file  . Food insecurity    Worry: Not on file     Inability: Not on file  . Transportation needs    Medical: Not on file    Non-medical: Not on file  Tobacco Use  . Smoking status: Never Smoker  . Smokeless tobacco: Never Used  Substance and Sexual Activity  . Alcohol use: No  . Drug use: No  . Sexual activity: Not on file  Lifestyle  . Physical activity    Days per week: Not on file    Minutes per session: Not on file  . Stress: Not on file  Relationships  . Social Musicianconnections    Talks on phone: Not on file    Gets together: Not on file    Attends religious service: Not on file    Active member of club or organization: Not on file    Attends meetings of clubs or organizations: Not on file    Relationship status: Not on file  . Intimate partner violence    Fear of current or ex partner: Not on file    Emotionally abused: Not on file    Physically abused: Not on file    Forced sexual activity: Not on file  Other Topics Concern  . Not on file  Social History Narrative  . Not on file    History reviewed. No pertinent family history.  Medications Prior to Admission  Medication Sig Dispense Refill Last Dose  . Apoaequorin (PREVAGEN PO) Take 1 tablet by mouth daily. CHEWABLES   04/20/2019 at Unknown time  . Apple Cider Vinegar 500 MG TABS Take 1,000 mg by mouth daily.   04/20/2019 at Unknown  time  . cetirizine (ZYRTEC) 10 MG tablet Take 10 mg by mouth every morning.   04/21/2019 at 0600  . Cholecalciferol (VITAMIN D) 50 MCG (2000 UT) tablet Take 6,000 Units by mouth daily. Liquid drops   04/20/2019 at Unknown time  . ELDERBERRY PO Take 2 tablets by mouth daily.   04/20/2019 at Unknown time  . esomeprazole (NEXIUM) 40 MG capsule Take 40 mg by mouth daily before breakfast.   04/21/2019 at 0600  . ibuprofen (ADVIL,MOTRIN) 200 MG tablet Take 600 mg by mouth every 6 (six) hours as needed. Pain   Past Week at Unknown time  . Methylcobalamin (B-12) 5000 MCG TBDP Take 5,000 mcg by mouth daily.   04/20/2019 at Unknown time  .  vitamin C (ASCORBIC ACID) 500 MG tablet Take 500 mg by mouth daily.   04/20/2019 at Unknown time    Current Facility-Administered Medications  Medication Dose Route Frequency Provider Last Rate Last Dose  . bupivacaine liposome (EXPAREL) 1.3 % injection 266 mg  20 mL Infiltration Once Michael Boston, MD      . Chlorhexidine Gluconate Cloth 2 % PADS 6 each  6 each Topical Once Michael Boston, MD       And  . Chlorhexidine Gluconate Cloth 2 % PADS 6 each  6 each Topical Once Michael Boston, MD      . lactated ringers infusion   Intravenous Continuous Albertha Ghee, MD 50 mL/hr at 04/21/19 [phone]       No Known Allergies  BP (!) 159/78   Pulse 97   Temp 99.1 F (37.3 C) (Oral)   Resp 14   Ht 4\' 10"  (1.473 m)   Wt 60 kg   SpO2 99%   BMI 27.64 kg/m   Labs: Results for orders placed or performed during the hospital encounter of 04/20/19 (from the past 48 hour(s))  CBC     Status: None   Collection Time: 04/20/19  2:42 PM  Result Value Ref Range   WBC 6.5 4.0 - 10.5 K/uL   RBC 4.78 3.87 - 5.11 MIL/uL   Hemoglobin 13.8 12.0 - 15.0 g/dL   HCT 42.3 36.0 - 46.0 %   MCV 88.5 80.0 - 100.0 fL   MCH 28.9 26.0 - 34.0 pg   MCHC 32.6 30.0 - 36.0 g/dL   RDW 13.1 11.5 - 15.5 %   Platelets 242 150 - 400 K/uL   nRBC 0.0 0.0 - 0.2 %    Comment: Performed at [facility] [place] [address], [place], [place] [address]    Imaging / Studies: US Abdomen Limited Ruq  Result Date: 04/06/2019 CLINICAL DATA:  Right upper quadrant pain with nausea and vomiting EXAM: ULTRASOUND ABDOMEN LIMITED RIGHT UPPER QUADRANT COMPARISON:  None. FINDINGS: Gallbladder: Within the gallbladder, there are echogenic foci which move and shadow consistent with cholelithiasis. Largest gallstone measures 1.0 cm in length. There is also sludge in the gallbladder. There is no gallbladder wall thickening or pericholecystic fluid. No sonographic Murphy sign noted by sonographer. Common bile duct: Diameter: 5 mm. No  intrahepatic or extrahepatic biliary duct dilatation. Liver: There is a 1.0 x 1.0 x 1.4 cm apparent cyst in the right lobe of the liver. There is a mildly septated cyst near the hepato renal fossa on the right measuring 2.3 x 2.0 x 1.5 cm. There are scattered apparent calcifications. Liver echogenicity overall is increased. Portal vein is patent on color Doppler imaging with normal direction of blood flow towards the liver. Other:  None. IMPRESSION: 1. Cholelithiasis and sludge in gallbladder. No gallbladder wall thickening or pericholecystic fluid. 2. Apparent mildly complex cysts in the right lobe of the liver. Scattered calcifications, likely indicative of prior granulomatous disease. The liver echogenicity is increased diffusely. Note that the sensitivity of ultrasound for detection of noncystic focal liver lesions is diminished in this circumstance. Electronically Signed   By: Bretta Bang III M.D.   On: 04/06/2019 07:51     .[name], M.D., F.A.C.S. Gastrointestinal and Minimally Invasive Surgery [facility] [address], [address] [place], [place] [address] [phone] Main / Paging  04/21/2019 12:17 PM    [name]

## 2019-04-21 NOTE — Anesthesia Procedure Notes (Signed)
Procedure Name: Intubation Date/Time: 04/21/2019 1:29 PM Performed by: [place] Korrin Waterfield C, CRNA Pre-anesthesia Checklist: Patient identified, Emergency Drugs available, Suction available and Patient being monitored Patient Re-evaluated:Patient Re-evaluated prior to induction Oxygen Delivery Method: Circle system utilized Preoxygenation: Pre-oxygenation with 100% oxygen Induction Type: IV induction Ventilation: Mask ventilation without difficulty Laryngoscope Size: Mac and 3 Grade View: Grade I Tube type: Oral Tube size: 7.0 mm Number of attempts: 1 Airway Equipment and Method: Stylet and Oral airway Placement Confirmation: ETT inserted through vocal cords under direct vision,  positive ETCO2 and breath sounds checked- equal and bilateral Secured at: 21 cm Tube secured with: Tape Dental Injury: Teeth and Oropharynx as per pre-operative assessment

## 2019-04-21 NOTE — Transfer of Care (Signed)
Immediate Anesthesia Transfer of Care Note  Patient: Regina Walters  Procedure(s) Performed: LAPAROSCOPIC CHOLECYSTECTOMY SINGLE SITE WITH INTRAOPERATIVE CHOLANGIOGRAM (N/A )  Patient Location: PACU  Anesthesia Type:General  Level of Consciousness: awake, alert  and oriented  Airway & Oxygen Therapy: Patient Spontanous Breathing and Patient connected to face mask oxygen  Post-op Assessment: Report given to RN and Post -op Vital signs reviewed and stable  Post vital signs: Reviewed and stable  Last Vitals:  Vitals Value Taken Time  BP 169/81 04/21/19 1455  Temp    Pulse 76 04/21/19 1459  Resp 17 04/21/19 1459  SpO2 100 % 04/21/19 1459  Vitals shown include unvalidated device data.  Last Pain:  Vitals:   04/21/19 0951  TempSrc:   PainSc: 0-No pain      Patients Stated Pain Goal: 4 (94/50/38 [id number])  Complications: No apparent anesthesia complications

## 2019-04-22 ENCOUNTER — Encounter (HOSPITAL_COMMUNITY): Payer: Self-pay | Admitting: Surgery

## 2019-04-27 LAB — SURGICAL PATHOLOGY

## 2019-06-02 ENCOUNTER — Telehealth (HOSPITAL_COMMUNITY): Payer: Self-pay | Admitting: Emergency Medicine

## 2019-06-02 ENCOUNTER — Other Ambulatory Visit: Payer: Self-pay

## 2019-06-02 ENCOUNTER — Emergency Department (HOSPITAL_COMMUNITY)
Admission: EM | Admit: 2019-06-02 | Discharge: 2019-06-02 | Disposition: A | Discharge status: Home / Self Care | Payer: PPO | Attending: Emergency Medicine | Admitting: Emergency Medicine

## 2019-06-02 ENCOUNTER — Emergency Department (HOSPITAL_COMMUNITY): Payer: PPO

## 2019-06-02 ENCOUNTER — Encounter (HOSPITAL_COMMUNITY): Payer: Self-pay

## 2019-06-02 DIAGNOSIS — Z79899 Other long term (current) drug therapy: Secondary | ICD-10-CM | POA: Diagnosis not present

## 2019-06-02 DIAGNOSIS — S4992XA Unspecified injury of left shoulder and upper arm, initial encounter: Secondary | ICD-10-CM | POA: Diagnosis present

## 2019-06-02 DIAGNOSIS — Y9301 Activity, walking, marching and hiking: Secondary | ICD-10-CM | POA: Diagnosis not present

## 2019-06-02 DIAGNOSIS — W108XXA Fall (on) (from) other stairs and steps, initial encounter: Secondary | ICD-10-CM | POA: Diagnosis not present

## 2019-06-02 DIAGNOSIS — Y92814 Boat as the place of occurrence of the external cause: Secondary | ICD-10-CM | POA: Diagnosis not present

## 2019-06-02 DIAGNOSIS — S42292A Other displaced fracture of upper end of left humerus, initial encounter for closed fracture: Secondary | ICD-10-CM | POA: Insufficient documentation

## 2019-06-02 DIAGNOSIS — S42202A Unspecified fracture of upper end of left humerus, initial encounter for closed fracture: Secondary | ICD-10-CM | POA: Diagnosis not present

## 2019-06-02 DIAGNOSIS — S42342A Displaced spiral fracture of shaft of humerus, left arm, initial encounter for closed fracture: Secondary | ICD-10-CM | POA: Diagnosis not present

## 2019-06-02 DIAGNOSIS — Y998 Other external cause status: Secondary | ICD-10-CM | POA: Diagnosis not present

## 2019-06-02 MED ORDER — OXYCODONE-ACETAMINOPHEN 5-325 MG PO TABS: 1.0000 | ORAL_TABLET | Freq: Four times a day (QID) | ORAL | 0 refills | Status: DC | PRN

## 2019-06-02 MED ORDER — HYDROCODONE-ACETAMINOPHEN 7.5-325 MG/15ML PO SOLN: 15.0000 mL | Freq: Four times a day (QID) | ORAL | 0 refills | Status: DC | PRN

## 2019-06-02 MED ORDER — OXYCODONE-ACETAMINOPHEN 5-325 MG PO TABS
1.0000 | ORAL_TABLET | Freq: Once | ORAL | Status: DC
  Filled 2019-06-02: qty 1

## 2019-06-02 MED ORDER — FENTANYL CITRATE (PF) 100 MCG/2ML IJ SOLN
25.0000 ug | Freq: Once | INTRAMUSCULAR | Status: AC
  Administered 2019-06-02: 16:00:00 25 ug via INTRAVENOUS
  Filled 2019-06-02: qty 2

## 2019-06-02 MED ORDER — OXYCODONE HCL 5 MG/5ML PO SOLN
5.0000 mg | Freq: Once | ORAL | Status: AC
  Administered 2019-06-02: 16:00:00 5 mg via ORAL
  Filled 2019-06-02: qty 5

## 2019-06-02 MED ORDER — FENTANYL CITRATE (PF) 100 MCG/2ML IJ SOLN
25.0000 ug | Freq: Once | INTRAMUSCULAR | Status: AC
  Administered 2019-06-02: 17:00:00 25 ug via INTRAVENOUS
  Filled 2019-06-02: qty 2

## 2019-06-02 NOTE — Telephone Encounter (Signed)
Patient is calling because she needed a pain medicine liquid and she was given the pills and she cannot take those

## 2019-06-02 NOTE — ED Notes (Signed)
Pt states the percocet is too large to swallow.

## 2019-06-02 NOTE — ED Provider Notes (Signed)
[facility] DEPT Provider Note   CSN: [id number] Arrival date & time: 06/02/19  1405     History   Chief Complaint Chief Complaint  Patient presents with  . Arm Injury    HPI Regina Walters is a 73 y.o. female presents to the emergency department with sudden onset of left upper arm pain that began after mechanical fall that occurred prior to arrival.  Patient states she was standing on a boat when she turned around and fell down 2 steps onto her left arm.  She did not hit her head or pass out.  She denies any other injuries including no neck or back pain, headache, elbow or wrist pain.  Her pain is to the proximal left arm that is significantly worse with any slight movement.  Denies numbness to the hand.  Denies wounds.  No medications tried prior to arrival.  Not on anticoagulation.     The history is provided by the patient.    Past Medical History:  Diagnosis Date  . Kidney stone on left side 2014    Patient Active Problem List   Diagnosis Date Noted  . Acute on chronic cholecystitis s/p lap cholecystectomy 04/21/2019 04/10/2019  . GERD (gastroesophageal reflux disease) 04/10/2019  . Liver cyst 04/09/2013    Past Surgical History:  Procedure Laterality Date  . LAPAROSCOPIC CHOLECYSTECTOMY SINGLE SITE WITH INTRAOPERATIVE CHOLANGIOGRAM N/A 04/21/2019   Procedure: LAPAROSCOPIC CHOLECYSTECTOMY SINGLE SITE WITH INTRAOPERATIVE CHOLANGIOGRAM;  Surgeon: Michael Boston, MD;  Location: [facility];  Service: General;  Laterality: N/A;  . TONSILLECTOMY       OB History   No obstetric history on file.      Home Medications    Prior to Admission medications   Medication Sig Start Date End Date Taking? Authorizing Provider  Apoaequorin (PREVAGEN PO) Take 1 tablet by mouth daily. CHEWABLES   Yes [provider]  Apple Cider Vinegar 500 MG TABS Take 1,000 mg by mouth daily.   Yes [provider]  cetirizine (ZYRTEC) 10 MG  tablet Take 10 mg by mouth every morning.   Yes [provider]  Cholecalciferol (VITAMIN D) 50 MCG (2000 UT) tablet Take 6,000 Units by mouth daily. Liquid drops   Yes [provider]  ELDERBERRY PO Take 2 tablets by mouth daily.   Yes [provider]  esomeprazole (NEXIUM) 40 MG capsule Take 40 mg by mouth daily before breakfast.   Yes [provider]  HYDROcodone-acetaminophen (NORCO/VICODIN) 5-325 MG tablet Take 1-2 tablets by mouth every 6 (six) hours as needed for pain. 04/07/19  Yes [provider]  ibuprofen (ADVIL,MOTRIN) 200 MG tablet Take 600 mg by mouth every 6 (six) hours as needed. Pain   Yes [provider]  Methylcobalamin (B-12) 5000 MCG TBDP Take 5,000 mcg by mouth daily.   Yes [provider]  ondansetron (ZOFRAN) 4 MG tablet Take 1 tablet (4 mg total) by mouth every 8 (eight) hours as needed for nausea. 04/21/19  Yes Michael Boston, MD  vitamin C (ASCORBIC ACID) 500 MG tablet Take 500 mg by mouth daily.   Yes [provider]  oxyCODONE-acetaminophen (PERCOCET/ROXICET) 5-325 MG tablet Take 1 tablet by mouth every 6 (six) hours as needed for severe pain. 06/02/19   Jovaun Levene, [place] N, PA-C  traMADol (ULTRAM) 50 MG tablet Take 1-2 tablets (50-100 mg total) by mouth every 6 (six) hours as needed for moderate pain or severe pain. Patient not taking: Reported on 06/02/2019 04/21/19  Karie SodaGross, Steven, MD    Family History History reviewed. No pertinent family history.  Social History Social History   Tobacco Use  . Smoking status: Never Smoker  . Smokeless tobacco: Never Used  Substance Use Topics  . Alcohol use: No  . Drug use: No     Allergies   Patient has no known allergies.   Review of Systems Review of Systems  All other systems reviewed and are negative.    Physical Exam Updated Vital Signs BP (!) 161/93   Pulse 87   Temp 98.4 F (36.9 C) (Oral)   Resp 18   Ht 4\' 11"  (1.499 m)   Wt  59 kg   SpO2 100%   BMI 26.26 kg/m   Physical Exam Vitals signs and nursing note reviewed.  Constitutional:      General: She is not in acute distress.    Appearance: She is well-developed.  HENT:     Head: Normocephalic and atraumatic.  Eyes:     Conjunctiva/sclera: Conjunctivae normal.  Cardiovascular:     Rate and Rhythm: Normal rate and regular rhythm.  Pulmonary:     Effort: Pulmonary effort is normal. No respiratory distress.     Breath sounds: Normal breath sounds.  Abdominal:     Palpations: Abdomen is soft.  Musculoskeletal:     Comments: Left proximal upper arm with bruising to the anterior lateral aspect and associated swelling.  Significant tenderness to humerus is present.  Unable to range the arm secondary to significant pain proximally.  However, the wrist and elbow are nontender without swelling.  The wrist ranges without pain in all directions.  No wounds.  Skin:    General: Skin is warm.  Neurological:     Mental Status: She is alert.     Comments: Normal sensation over the left deltoid region and to the distal left digits.  Strong and equal grip strength bilateral upper extremities.  Strong radial pulses.  Psychiatric:        Behavior: Behavior normal.      ED Treatments / Results  Labs (all labs ordered are listed, but only abnormal results are displayed) Labs Reviewed - No data to display  EKG None  Radiology Dg Shoulder Left  Result Date: 06/02/2019 CLINICAL DATA:  Left upper arm pain secondary to a fall. EXAM: LEFT SHOULDER - 2+ VIEW COMPARISON:  None. FINDINGS: There is a slightly displaced spiral fracture of the proximal left humeral shaft. No dislocation. AC joint and glenohumeral joint appear normal. IMPRESSION: Slightly displaced spiral fracture of the proximal left humeral shaft. Electronically Signed   By: Francene BoyersJames  Maxwell M.D.   On: 06/02/2019 14:56   Dg Humerus Left  Result Date: 06/02/2019 CLINICAL DATA:  Left arm pain secondary to a  fall. EXAM: LEFT HUMERUS - 2+ VIEW COMPARISON:  None. FINDINGS: There is a spiral fracture of the proximal shaft of the left humerus with slight displacement. The humeral head is not dislocated. Distal humerus is normal. IMPRESSION: Spiral fracture of the proximal shaft of the left humerus with slight displacement. Electronically Signed   By: Francene BoyersJames  Maxwell M.D.   On: 06/02/2019 14:56    Procedures Procedures (including critical care time)  Medications Ordered in ED Medications  fentaNYL (SUBLIMAZE) injection 25 mcg (25 mcg Intravenous Given 06/02/19 1544)  oxyCODONE (ROXICODONE) 5 MG/5ML solution 5 mg (5 mg Oral Given 06/02/19 1628)  fentaNYL (SUBLIMAZE) injection 25 mcg (25 mcg Intravenous Given 06/02/19 1633)     Initial Impression /  Assessment and Plan / ED Course  I have reviewed the triage vital signs and the nursing notes.  Pertinent labs & imaging results that were available during my care of the patient were reviewed by me and considered in my medical decision making (see chart for details).  Clinical Course as of Jun 01 1745  Fri Jun 02, 2019  1553 Consulted with Dr. Magnus Ivan with ortho, who reviewed imaging and requests Coaptation splint and sling. Pt to Call on Monday for appt next week.    [JR]  1628 Ortho tech spoke with Dr. Magnus Ivan, will just sling and cancel coaptation splint, per Dr. Magnus Ivan   [JR]    Clinical Course User Index [JR] Jerilyn Gillaspie, [place] N, PA-C       Patient presenting with left arm pain after mechanical fall that occurred prior to arrival.  No head trauma or LOC, not on anticoagulation. No other injuries reported.  Imaging with mildly displaced proximal left humerus fracture.  No other acute pathology identified.  Fracture is closed, patient is neurovascularly intact.  Pain treated in the ED.  Consulted with Dr. Magnus Ivan who recommends sling and outpatient follow-up next week.  Will prescribe pain medication, encouraged ice, immobilization.  Patient  agreeable to plan and safe for discharge.  Discussed results, findings, treatment and follow up. Patient advised of return precautions. Patient verbalized understanding and agreed with plan.   Final Clinical Impressions(s) / ED Diagnoses   Final diagnoses:  Other closed displaced fracture of proximal end of left humerus, initial encounter    ED Discharge Orders         Ordered    oxyCODONE-acetaminophen (PERCOCET/ROXICET) 5-325 MG tablet  Every 6 hours PRN     06/02/19 1658           Zarahi Fuerst, [place] N, [place] 06/02/19 1747    Pricilla Loveless, MD 06/02/19 2342

## 2019-06-02 NOTE — Discharge Instructions (Signed)
Please read instructions below. Keep the sling on at all times. Do not hold any weight in your left arm. You may be more comfortable sleeping in a recliner as opposed to laying flat. You can take oxycodone every 6 hours as needed for severe/breakthrough pain. You can take ibuprofen every 6 hours for pain and swelling. Call the orthopedic specialist office, Dr. Ninfa Linden, the next business day to schedule an appointment for follow up on your injury. Return to the ED for concerning symptoms.

## 2019-06-02 NOTE — ED Triage Notes (Signed)
Pt presents with c/o left arm injury. Pt missed a step stepping out of a gazebo, landing on that left arm. Pt c/o pain in the middle part of her upper arm.

## 2019-06-08 ENCOUNTER — Other Ambulatory Visit: Payer: Self-pay

## 2019-06-08 ENCOUNTER — Encounter (HOSPITAL_COMMUNITY): Payer: Self-pay | Admitting: *Deleted

## 2019-06-08 ENCOUNTER — Other Ambulatory Visit: Payer: Self-pay | Admitting: Orthopedic Surgery

## 2019-06-08 DIAGNOSIS — S42202A Unspecified fracture of upper end of left humerus, initial encounter for closed fracture: Secondary | ICD-10-CM | POA: Diagnosis not present

## 2019-06-08 NOTE — Anesthesia Preprocedure Evaluation (Addendum)
Anesthesia Evaluation  Patient identified by MRN, date of birth, ID band Patient awake    Reviewed: Allergy & Precautions, NPO status , Patient's Chart, lab work & pertinent test results  History of Anesthesia Complications Negative for: history of anesthetic complications  Airway Mallampati: II  TM Distance: >3 FB Neck ROM: Full    Dental  (+) Lower Dentures, Upper Dentures   Pulmonary neg pulmonary ROS,    Pulmonary exam normal        Cardiovascular negative cardio ROS Normal cardiovascular exam     Neuro/Psych negative neurological ROS  negative psych ROS   GI/Hepatic Neg liver ROS, GERD  Medicated and Controlled,  Endo/Other  negative endocrine ROS  Renal/GU negative Renal ROS  negative genitourinary   Musculoskeletal Left proximal humerus fracture   Abdominal   Peds  Hematology negative hematology ROS (+)   Anesthesia Other Findings Day of surgery medications reviewed with patient.  Reproductive/Obstetrics negative OB ROS                            Anesthesia Physical Anesthesia Plan  ASA: II  Anesthesia Plan: General   Post-op Pain Management: GA combined w/ Regional for post-op pain   Induction: Intravenous  PONV Risk Score and Plan: 3 and Treatment may vary due to age or medical condition, Ondansetron, Dexamethasone and Midazolam  Airway Management Planned: Oral ETT  Additional Equipment: None  Intra-op Plan:   Post-operative Plan: Extubation in OR  Informed Consent: I have reviewed the patients History and Physical, chart, labs and discussed the procedure including the risks, benefits and alternatives for the proposed anesthesia with the patient or authorized representative who has indicated his/her understanding and acceptance.     Dental advisory given  Plan Discussed with: CRNA  Anesthesia Plan Comments:        Anesthesia Quick Evaluation

## 2019-06-09 ENCOUNTER — Encounter (HOSPITAL_COMMUNITY)
Admission: RE | Disposition: A | Discharge status: Home / Self Care | Payer: Self-pay | Source: Home / Self Care | Attending: Orthopedic Surgery

## 2019-06-09 ENCOUNTER — Ambulatory Visit (HOSPITAL_COMMUNITY): Payer: PPO | Admitting: Registered Nurse

## 2019-06-09 ENCOUNTER — Ambulatory Visit (HOSPITAL_COMMUNITY): Payer: PPO

## 2019-06-09 ENCOUNTER — Other Ambulatory Visit: Payer: Self-pay

## 2019-06-09 ENCOUNTER — Ambulatory Visit (HOSPITAL_COMMUNITY)
Admission: RE | Admit: 2019-06-09 | Discharge: 2019-06-09 | Disposition: A | Discharge status: Home / Self Care | Payer: PPO | Attending: Orthopedic Surgery | Admitting: Orthopedic Surgery

## 2019-06-09 ENCOUNTER — Encounter (HOSPITAL_COMMUNITY): Payer: Self-pay | Admitting: *Deleted

## 2019-06-09 DIAGNOSIS — W19XXXA Unspecified fall, initial encounter: Secondary | ICD-10-CM | POA: Insufficient documentation

## 2019-06-09 DIAGNOSIS — K219 Gastro-esophageal reflux disease without esophagitis: Secondary | ICD-10-CM | POA: Diagnosis not present

## 2019-06-09 DIAGNOSIS — S42202A Unspecified fracture of upper end of left humerus, initial encounter for closed fracture: Secondary | ICD-10-CM | POA: Diagnosis not present

## 2019-06-09 DIAGNOSIS — Z20828 Contact with and (suspected) exposure to other viral communicable diseases: Secondary | ICD-10-CM | POA: Insufficient documentation

## 2019-06-09 DIAGNOSIS — Z419 Encounter for procedure for purposes other than remedying health state, unspecified: Secondary | ICD-10-CM

## 2019-06-09 DIAGNOSIS — G8918 Other acute postprocedural pain: Secondary | ICD-10-CM | POA: Diagnosis not present

## 2019-06-09 DIAGNOSIS — S42302D Unspecified fracture of shaft of humerus, left arm, subsequent encounter for fracture with routine healing: Secondary | ICD-10-CM | POA: Diagnosis not present

## 2019-06-09 HISTORY — DX: Personal history of urinary calculi: Z87.442

## 2019-06-09 HISTORY — PX: ORIF HUMERUS FRACTURE: SHX2126

## 2019-06-09 LAB — BASIC METABOLIC PANEL
Anion gap: 10 (ref 5–15)
BUN: 12 mg/dL (ref 8–23)
CO2: 30 mmol/L (ref 22–32)
Calcium: 9.2 mg/dL (ref 8.9–10.3)
Chloride: 99 mmol/L (ref 98–111)
Creatinine, Ser: 0.5 mg/dL (ref 0.44–1.00)
GFR calc Af Amer: >60 mL/min (ref >60)
GFR calc non Af Amer: >60 mL/min (ref >60)
Glucose, Bld: 122 mg/dL — ABNORMAL HIGH (ref 70–99)
Potassium: 3.2 mmol/L — ABNORMAL LOW (ref 3.5–5.1)
Sodium: 139 mmol/L (ref 135–145)

## 2019-06-09 LAB — CBC
HCT: 32.9 % — ABNORMAL LOW (ref 36.0–46.0)
Hemoglobin: 10.4 g/dL — ABNORMAL LOW (ref 12.0–15.0)
MCH: 28.6 pg (ref 26.0–34.0)
MCHC: 31.6 g/dL (ref 30.0–36.0)
MCV: 90.4 fL (ref 80.0–100.0)
Platelets: 266 10*3/uL (ref 150–400)
RBC: 3.64 MIL/uL — ABNORMAL LOW (ref 3.87–5.11)
RDW: 13.6 % (ref 11.5–15.5)
WBC: 8.3 10*3/uL (ref 4.0–10.5)
nRBC: 0 % (ref 0.0–0.2)

## 2019-06-09 LAB — SARS CORONAVIRUS 2 BY RT PCR (HOSPITAL ORDER, PERFORMED IN ~~LOC~~ HOSPITAL LAB): SARS Coronavirus 2: NEGATIVE

## 2019-06-09 SURGERY — OPEN REDUCTION INTERNAL FIXATION (ORIF) HUMERAL SHAFT FRACTURE
Anesthesia: General | Laterality: Left

## 2019-06-09 MED ORDER — LIDOCAINE 2% (20 MG/ML) 5 ML SYRINGE
INTRAMUSCULAR | Status: DC | PRN
  Administered 2019-06-09: 20 mg via INTRAVENOUS
  Administered 2019-06-09: 40 mg via INTRAVENOUS

## 2019-06-09 MED ORDER — BUPIVACAINE-EPINEPHRINE (PF) 0.25% -1:200000 IJ SOLN
INTRAMUSCULAR | Status: DC | PRN
  Administered 2019-06-09: 15 mL via PERINEURAL

## 2019-06-09 MED ORDER — ONDANSETRON HCL 4 MG/2ML IJ SOLN
INTRAMUSCULAR | Status: DC | PRN
  Administered 2019-06-09: 4 mg via INTRAVENOUS

## 2019-06-09 MED ORDER — FENTANYL CITRATE (PF) 100 MCG/2ML IJ SOLN
INTRAMUSCULAR | Status: AC
  Filled 2019-06-09: qty 2

## 2019-06-09 MED ORDER — PROPOFOL 10 MG/ML IV BOLUS
INTRAVENOUS | Status: AC
  Filled 2019-06-09: qty 20

## 2019-06-09 MED ORDER — ACETAMINOPHEN 500 MG PO TABS
1000.0000 mg | ORAL_TABLET | Freq: Once | ORAL | Status: DC
  Filled 2019-06-09: qty 2

## 2019-06-09 MED ORDER — LIDOCAINE 2% (20 MG/ML) 5 ML SYRINGE
INTRAMUSCULAR | Status: AC
  Filled 2019-06-09: qty 5

## 2019-06-09 MED ORDER — PHENYLEPHRINE HCL-NACL 20-0.9 MG/250ML-% IV SOLN
INTRAVENOUS | Status: DC | PRN
  Administered 2019-06-09: 15 ug/min via INTRAVENOUS

## 2019-06-09 MED ORDER — ROCURONIUM BROMIDE 10 MG/ML (PF) SYRINGE
PREFILLED_SYRINGE | INTRAVENOUS | Status: DC | PRN
  Administered 2019-06-09: 50 mg via INTRAVENOUS

## 2019-06-09 MED ORDER — LACTATED RINGERS IV SOLN
INTRAVENOUS | Status: DC
  Administered 2019-06-09: 09:00:00 via INTRAVENOUS

## 2019-06-09 MED ORDER — BUPIVACAINE HCL (PF) 0.25 % IJ SOLN
INTRAMUSCULAR | Status: AC
  Filled 2019-06-09: qty 30

## 2019-06-09 MED ORDER — PROMETHAZINE HCL 25 MG/ML IJ SOLN: 6.2500 mg | INTRAMUSCULAR | Status: DC | PRN

## 2019-06-09 MED ORDER — OXYCODONE HCL 5 MG PO TABS: 5.0000 mg | ORAL_TABLET | Freq: Once | ORAL | Status: DC | PRN

## 2019-06-09 MED ORDER — OXYCODONE HCL 5 MG/5ML PO SOLN: 5.0000 mg | Freq: Once | ORAL | Status: DC | PRN

## 2019-06-09 MED ORDER — 0.9 % SODIUM CHLORIDE (POUR BTL) OPTIME
TOPICAL | Status: DC | PRN
  Administered 2019-06-09: 1000 mL

## 2019-06-09 MED ORDER — BUPIVACAINE LIPOSOME 1.3 % IJ SUSP
INTRAMUSCULAR | Status: DC | PRN
  Administered 2019-06-09: 10 mL via PERINEURAL

## 2019-06-09 MED ORDER — SUGAMMADEX SODIUM 200 MG/2ML IV SOLN
INTRAVENOUS | Status: DC | PRN
  Administered 2019-06-09: 120 mg via INTRAVENOUS

## 2019-06-09 MED ORDER — FENTANYL CITRATE (PF) 100 MCG/2ML IJ SOLN
INTRAMUSCULAR | Status: DC | PRN
  Administered 2019-06-09 (×2): 50 ug via INTRAVENOUS

## 2019-06-09 MED ORDER — MIDAZOLAM HCL 2 MG/2ML IJ SOLN
INTRAMUSCULAR | Status: AC
  Filled 2019-06-09: qty 2

## 2019-06-09 MED ORDER — ENSURE PRE-SURGERY PO LIQD
296.0000 mL | Freq: Once | ORAL | Status: DC
  Filled 2019-06-09: qty 296

## 2019-06-09 MED ORDER — MIDAZOLAM HCL 5 MG/5ML IJ SOLN
INTRAMUSCULAR | Status: DC | PRN
  Administered 2019-06-09: 1 mg via INTRAVENOUS

## 2019-06-09 MED ORDER — FENTANYL CITRATE (PF) 100 MCG/2ML IJ SOLN
25.0000 ug | INTRAMUSCULAR | Status: DC | PRN
  Administered 2019-06-09: 25 ug via INTRAVENOUS
  Administered 2019-06-09: 50 ug via INTRAVENOUS

## 2019-06-09 MED ORDER — PHENYLEPHRINE HCL (PRESSORS) 10 MG/ML IV SOLN
INTRAVENOUS | Status: AC
  Filled 2019-06-09: qty 2

## 2019-06-09 MED ORDER — POVIDONE-IODINE 10 % EX SWAB
2.0000 "application " | Freq: Once | CUTANEOUS | Status: AC
  Administered 2019-06-09: 2 via TOPICAL

## 2019-06-09 MED ORDER — ROCURONIUM BROMIDE 10 MG/ML (PF) SYRINGE
PREFILLED_SYRINGE | INTRAVENOUS | Status: AC
  Filled 2019-06-09: qty 10

## 2019-06-09 MED ORDER — ONDANSETRON HCL 4 MG/2ML IJ SOLN
INTRAMUSCULAR | Status: AC
  Filled 2019-06-09: qty 2

## 2019-06-09 MED ORDER — BUPIVACAINE HCL (PF) 0.25 % IJ SOLN
INTRAMUSCULAR | Status: DC | PRN
  Administered 2019-06-09: 30 mL

## 2019-06-09 MED ORDER — FENTANYL CITRATE (PF) 100 MCG/2ML IJ SOLN
INTRAMUSCULAR | Status: AC
  Administered 2019-06-09: 50 ug via INTRAVENOUS
  Filled 2019-06-09: qty 2

## 2019-06-09 MED ORDER — CEFAZOLIN SODIUM-DEXTROSE 2-4 GM/100ML-% IV SOLN
2.0000 g | INTRAVENOUS | Status: AC
  Administered 2019-06-09: 2 g via INTRAVENOUS
  Filled 2019-06-09: qty 100

## 2019-06-09 MED ORDER — MIDAZOLAM HCL 2 MG/2ML IJ SOLN
INTRAMUSCULAR | Status: AC
  Administered 2019-06-09: 1 mg via INTRAVENOUS
  Filled 2019-06-09: qty 2

## 2019-06-09 MED ORDER — SODIUM CHLORIDE 0.9 % IV SOLN
INTRAVENOUS | Status: AC
  Filled 2019-06-09: qty 500000

## 2019-06-09 MED ORDER — FENTANYL CITRATE (PF) 100 MCG/2ML IJ SOLN
50.0000 ug | INTRAMUSCULAR | Status: DC
  Administered 2019-06-09: 09:00:00 50 ug via INTRAVENOUS

## 2019-06-09 MED ORDER — PROPOFOL 10 MG/ML IV BOLUS
INTRAVENOUS | Status: DC | PRN
  Administered 2019-06-09: 110 mg via INTRAVENOUS
  Administered 2019-06-09: 30 mg via INTRAVENOUS

## 2019-06-09 MED ORDER — MIDAZOLAM HCL 2 MG/2ML IJ SOLN
1.0000 mg | INTRAMUSCULAR | Status: DC
  Administered 2019-06-09: 09:00:00 1 mg via INTRAVENOUS

## 2019-06-09 MED ORDER — DEXAMETHASONE SODIUM PHOSPHATE 10 MG/ML IJ SOLN
INTRAMUSCULAR | Status: DC | PRN
  Administered 2019-06-09: 8 mg via INTRAVENOUS

## 2019-06-09 MED ORDER — ACETAMINOPHEN 160 MG/5ML PO SOLN: 1000.0000 mg | Freq: Once | ORAL | Status: DC

## 2019-06-09 MED ORDER — CHLORHEXIDINE GLUCONATE 4 % EX LIQD: 60.0000 mL | Freq: Once | CUTANEOUS | Status: DC

## 2019-06-09 SURGICAL SUPPLY — 58 items
BAG ZIPLOCK 12X15 (MISCELLANEOUS) ×2 IMPLANT
BENZOIN TINCTURE PRP APPL 2/3 (GAUZE/BANDAGES/DRESSINGS) ×2 IMPLANT
BIT DRILL CALIBRATED AFFXS 3.3 (DRILL) ×1 IMPLANT
BIT DRILL SURG TIB 3.3X152.5 (DRILL) ×1 IMPLANT
BNDG ELASTIC 3X5.8 VLCR STR LF (GAUZE/BANDAGES/DRESSINGS) ×2 IMPLANT
BNDG ELASTIC 4X5.8 VLCR STR LF (GAUZE/BANDAGES/DRESSINGS) ×2 IMPLANT
CLSR STERI-STRIP ANTIMIC 1/2X4 (GAUZE/BANDAGES/DRESSINGS) ×2 IMPLANT
COVER SURGICAL LIGHT HANDLE (MISCELLANEOUS) ×2 IMPLANT
COVER WAND RF STERILE (DRAPES) IMPLANT
CUFF TOURN SGL QUICK 18X4 (TOURNIQUET CUFF) ×2 IMPLANT
DRAPE C-ARM 42X120 X-RAY (DRAPES) ×2 IMPLANT
DRAPE U-SHAPE 47X51 STRL (DRAPES) ×2 IMPLANT
DRILL CALIBRATED AFFIXUS 3.3 (DRILL) ×2
DRILL SURG TIB 3.3X152.5 (DRILL) ×2
DRSG EMULSION OIL 3X16 NADH (GAUZE/BANDAGES/DRESSINGS) ×2 IMPLANT
DRSG EMULSION OIL 3X3 NADH (GAUZE/BANDAGES/DRESSINGS) ×2 IMPLANT
DRSG MEPILEX BORDER 4X4 (GAUZE/BANDAGES/DRESSINGS) ×2 IMPLANT
DRSG PAD ABDOMINAL 8X10 ST (GAUZE/BANDAGES/DRESSINGS) ×2 IMPLANT
DURAPREP 26ML APPLICATOR (WOUND CARE) ×2 IMPLANT
ELECT REM PT RETURN 15FT ADLT (MISCELLANEOUS) ×2 IMPLANT
GAUZE SPONGE 4X4 12PLY STRL (GAUZE/BANDAGES/DRESSINGS) ×2 IMPLANT
GLOVE SURG ORTHO 8.5 STRL (GLOVE) ×2 IMPLANT
GUIDEWIRE BALL NOSE AFFIXUS (WIRE) ×2 IMPLANT
K-WIRE 2.0 (WIRE) ×1
K-WIRE FXSTD 280X2XNS SS (WIRE) ×1
K-WIRE W/TROCAR TIP 2.5 (WIRE) ×4
KIT TURNOVER KIT A (KITS) IMPLANT
KWIRE FXSTD 280X2XNS SS (WIRE) ×1 IMPLANT
KWIRE W/TROCAR TIP 2.5 (WIRE) ×2 IMPLANT
MANIFOLD NEPTUNE II (INSTRUMENTS) ×2 IMPLANT
NAIL PRX HMRUS LT LONG 8.5X200 (Miscellaneous) ×2 IMPLANT
NS IRRIG 1000ML POUR BTL (IV SOLUTION) ×2 IMPLANT
PACK ORTHO EXTREMITY (CUSTOM PROCEDURE TRAY) ×2 IMPLANT
PACK SHOULDER (CUSTOM PROCEDURE TRAY) ×2 IMPLANT
PAD CAST 4YDX4 CTTN HI CHSV (CAST SUPPLIES) ×1 IMPLANT
PADDING CAST COTTON 4X4 STRL (CAST SUPPLIES) ×1
PENCIL SMOKE EVACUATOR (MISCELLANEOUS) ×2 IMPLANT
PROTECTOR NERVE ULNAR (MISCELLANEOUS) ×2 IMPLANT
SCREW AFFIXUS BLUNT TIP 4X44 (Screw) ×2 IMPLANT
SCREW ANN CORT BONE 4X20 (Screw) ×2 IMPLANT
SCREW BLUNT TIP AFFIXUS 4X38 (Screw) ×4 IMPLANT
SCREW BLUNT TIP AFFIXUS 4X60 (Screw) ×2 IMPLANT
SCREW CORT AFFIXUS 4X24 (Screw) ×2 IMPLANT
SLING ARM FOAM STRAP LRG (SOFTGOODS) ×2 IMPLANT
SLING ARM FOAM STRAP MED (SOFTGOODS) ×2 IMPLANT
STRIP CLOSURE SKIN 1/2X4 (GAUZE/BANDAGES/DRESSINGS) ×2 IMPLANT
SUT ETHILON 3 0 PS 1 (SUTURE) ×6 IMPLANT
SUT FIBERWIRE #2 38 T-5 BLUE (SUTURE) ×4
SUT MNCRL AB 3-0 PS2 18 (SUTURE) ×2 IMPLANT
SUT MNCRL AB 4-0 PS2 18 (SUTURE) ×2 IMPLANT
SUT VIC AB 1 CT1 27 (SUTURE) ×2
SUT VIC AB 1 CT1 27XBRD ANTBC (SUTURE) ×2 IMPLANT
SUT VIC AB 2-0 CT1 27 (SUTURE) ×2
SUT VIC AB 2-0 CT1 TAPERPNT 27 (SUTURE) ×2 IMPLANT
SUTURE FIBERWR #2 38 T-5 BLUE (SUTURE) ×2 IMPLANT
TAPE CLOTH SURG 6X10 WHT LF (GAUZE/BANDAGES/DRESSINGS) ×2 IMPLANT
TOWEL OR 17X26 10 PK STRL BLUE (TOWEL DISPOSABLE) ×2 IMPLANT
WATER STERILE IRR 1000ML POUR (IV SOLUTION) ×2 IMPLANT

## 2019-06-09 NOTE — Anesthesia Procedure Notes (Signed)
Anesthesia Regional Block: Interscalene brachial plexus block   Pre-Anesthetic Checklist: ,, timeout performed, Correct Patient, Correct Site, Correct Laterality, Correct Procedure, Correct Position, site marked, Risks and benefits discussed, pre-op evaluation,  At surgeon's request and post-op pain management  Laterality: Left  Prep: Maximum Sterile Barrier Precautions used, chloraprep       Needles:  Injection technique: Single-shot  Needle Type: Echogenic Stimulator Needle     Needle Length: 4cm  Needle Gauge: 22     Additional Needles:   Procedures:,,,, ultrasound used (permanent image in chart),,,,  Narrative:  Start time: 06/09/2019 9:27 AM End time: 06/09/2019 9:30 AM Injection made incrementally with aspirations every 5 mL.  Performed by: Personally  Anesthesiologist: Brennan Bailey, MD  Additional Notes: Risks, benefits, and alternative discussed. Patient gave consent for procedure. Patient prepped and draped in sterile fashion. Sedation administered, patient remains easily responsive to voice. Relevant anatomy identified with ultrasound guidance. Local anesthetic given in 5cc increments with no signs or symptoms of intravascular injection. No pain or paraesthesias with injection. Patient monitored throughout procedure with signs of LAST or immediate complications. Tolerated well. Ultrasound image placed in chart.  Tawny Asal, MD

## 2019-06-09 NOTE — Transfer of Care (Signed)
Immediate Anesthesia Transfer of Care Note  Patient: Regina Walters  Procedure(s) Performed: [facility] NAILING OF LEFT PROXIMAL HUMERUS (Left )  Patient Location: PACU  Anesthesia Type:General  Level of Consciousness: awake, alert , oriented and patient cooperative  Airway & Oxygen Therapy: Patient Spontanous Breathing and Patient connected to face mask oxygen  Post-op Assessment: Report given to RN, Post -op Vital signs reviewed and stable and Patient moving all extremities  Post vital signs: Reviewed and stable  Last Vitals:  Vitals Value Taken Time  BP 145/74 06/09/19 1410  Temp    Pulse 119 06/09/19 1411  Resp 22 06/09/19 1411  SpO2 100 % 06/09/19 1411  Vitals shown include unvalidated device data.  Last Pain:  Vitals:   06/09/19 1000  TempSrc:   PainSc: 0-No pain         Complications: No apparent anesthesia complications

## 2019-06-09 NOTE — Anesthesia Procedure Notes (Signed)
Procedure Name: Intubation Date/Time: 06/09/2019 10:23 AM Performed by: Victoriano Lain, CRNA Pre-anesthesia Checklist: Patient identified, Emergency Drugs available, Suction available, Patient being monitored and Timeout performed Patient Re-evaluated:Patient Re-evaluated prior to induction Oxygen Delivery Method: Circle system utilized Preoxygenation: Pre-oxygenation with 100% oxygen Induction Type: IV induction Ventilation: Mask ventilation without difficulty Laryngoscope Size: Mac and 3 Grade View: Grade I Tube type: Oral Tube size: 7.0 mm Number of attempts: 1 Airway Equipment and Method: Stylet Placement Confirmation: ETT inserted through vocal cords under direct vision,  positive ETCO2 and breath sounds checked- equal and bilateral Secured at: 21 cm Tube secured with: Tape Dental Injury: Teeth and Oropharynx as per pre-operative assessment

## 2019-06-09 NOTE — Discharge Instructions (Signed)
Wear the shoulder immobilizer most of the time.  You may remove it for gentle elbow and very gentle shoulder range of motion.  Keep your dressing clean and dry.  You can unwrap the Ace wrap from your left arm in 3 or 4 days.  If your hand/forearm swells rewrap the Ace wrap.  Apply ice to your left shoulder and upper arm for a couple of days.  Use your pain medicine and muscle relaxers as needed.

## 2019-06-09 NOTE — H&P (Signed)
A pre op hand p   Chief Complaint: l arm pain   HPI: Regina Walters is a 73 y.o. female who presents for evaluation of l arm pain. It has been present for 1 week and has been worsening. She has failed conservative measures. Pain is rated as moderate.  Past Medical History:  Diagnosis Date  . History of kidney stones   . Kidney stone on left side 2014   Past Surgical History:  Procedure Laterality Date  . LAPAROSCOPIC CHOLECYSTECTOMY SINGLE SITE WITH INTRAOPERATIVE CHOLANGIOGRAM N/A 04/21/2019   Procedure: LAPAROSCOPIC CHOLECYSTECTOMY SINGLE SITE WITH INTRAOPERATIVE CHOLANGIOGRAM;  Surgeon: Karie Soda, MD;  Location: [facility];  Service: General;  Laterality: N/A;  . TONSILLECTOMY     Social History   Socioeconomic History  . Marital status: Married    Spouse name: Not on file  . Number of children: Not on file  . Years of education: Not on file  . Highest education level: Not on file  Occupational History  . Not on file  Social Needs  . Financial resource strain: Not on file  . Food insecurity    Worry: Not on file    Inability: Not on file  . Transportation needs    Medical: Not on file    Non-medical: Not on file  Tobacco Use  . Smoking status: Never Smoker  . Smokeless tobacco: Never Used  Substance and Sexual Activity  . Alcohol use: No  . Drug use: No  . Sexual activity: Not on file  Lifestyle  . Physical activity    Days per week: Not on file    Minutes per session: Not on file  . Stress: Not on file  Relationships  . Social Musician on phone: Not on file    Gets together: Not on file    Attends religious service: Not on file    Active member of club or organization: Not on file    Attends meetings of clubs or organizations: Not on file    Relationship status: Not on file  Other Topics Concern  . Not on file  Social History Narrative  . Not on file   History reviewed. No pertinent family history. No Known Allergies Prior to  Admission medications   Medication Sig Start Date End Date Taking? Authorizing Provider  Apoaequorin (PREVAGEN PO) Take 1 tablet by mouth daily. CHEWABLES    [provider]  Apple Cider Vinegar 500 MG TABS Take 1,000 mg by mouth daily.    [provider]  cetirizine (ZYRTEC) 10 MG tablet Take 10 mg by mouth every morning.    [provider]  Cholecalciferol (VITAMIN D) 50 MCG (2000 UT) tablet Take 6,000 Units by mouth daily. Liquid drops    [provider]  ELDERBERRY PO Take 2 tablets by mouth daily.    [provider]  esomeprazole (NEXIUM) 40 MG capsule Take 40 mg by mouth daily before breakfast.    [provider]  HYDROcodone-acetaminophen (HYCET) 7.5-325 mg/15 ml solution Take 15 mLs by mouth 4 (four) times daily as needed for moderate pain. 06/02/19 06/01/20  Gwyneth Sprout, MD  ibuprofen (ADVIL,MOTRIN) 200 MG tablet Take 600 mg by mouth every 6 (six) hours as needed. Pain    [provider]  Methylcobalamin (B-12) 5000 MCG TBDP Take 5,000 mcg by mouth daily.    [provider]  ondansetron (ZOFRAN) 4 MG tablet Take 1 tablet (4 mg total) by mouth every 8 (eight) hours  as needed for nausea. 04/21/19   Michael Boston, MD  oxyCODONE-acetaminophen (PERCOCET/ROXICET) 5-325 MG tablet Take 1 tablet by mouth every 6 (six) hours as needed for severe pain. 06/02/19   Robinson, [place] N, PA-C  traMADol (ULTRAM) 50 MG tablet Take 1-2 tablets (50-100 mg total) by mouth every 6 (six) hours as needed for moderate pain or severe pain. Patient not taking: Reported on 06/02/2019 04/21/19   Michael Boston, MD  vitamin C (ASCORBIC ACID) 500 MG tablet Take 500 mg by mouth daily.    [provider]     Positive ROS: none  All other systems have been reviewed and were otherwise negative with the exception of those mentioned in the HPI and as above.  Physical Exam: There were no vitals filed for this visit.  General: Alert,  no acute distress Cardiovascular: No pedal edema Respiratory: No cyanosis, no use of accessory musculature GI: No organomegaly, abdomen is soft and non-tender Skin: No lesions in the area of chief complaint Neurologic: Sensation intact distally Psychiatric: Patient is competent for consent with normal mood and affect Lymphatic: No axillary or cervical lymphadenopathy  MUSCULOSKELETAL: l arm limited rom painful rom XRAY: fracture l prox humerus  Assessment/Plan: DISPLACED LEFT PROXIMAL HUMERUS FRACTURE Plan for Procedure(s): IMTRAMEDULLARY NAILING OF LEFT PROXIMAL HUMERUS  The risks benefits and alternatives were discussed with the patient including but not limited to the risks of nonoperative treatment, versus surgical intervention including infection, bleeding, nerve injury, malunion, nonunion, hardware prominence, hardware failure, need for hardware removal, blood clots, cardiopulmonary complications, morbidity, mortality, among others, and they were willing to proceed.  Predicted outcome is good, although there will be at least a six to nine month expected recovery.  Alta Corning, MD 06/09/2019 6:57 AM

## 2019-06-09 NOTE — Anesthesia Postprocedure Evaluation (Signed)
Anesthesia Post Note  Patient: Regina Walters  Procedure(s) Performed: [facility] NAILING OF LEFT PROXIMAL HUMERUS (Left )     Patient location during evaluation: PACU Anesthesia Type: General Level of consciousness: awake and alert and oriented Pain management: pain level controlled Vital Signs Assessment: post-procedure vital signs reviewed and stable Respiratory status: spontaneous breathing, nonlabored ventilation and respiratory function stable Cardiovascular status: blood pressure returned to baseline Postop Assessment: no apparent nausea or vomiting Anesthetic complications: no    Last Vitals:  Vitals:   06/09/19 1500 06/09/19 1515  BP: 138/77 133/72  Pulse: (!) 115 (!) 113  Resp: 13   Temp:    SpO2: 96%     Last Pain:  Vitals:   06/09/19 1515  TempSrc:   PainSc: 0-No pain                 Brennan Bailey

## 2019-06-09 NOTE — Progress Notes (Signed)
Assisted Dr. Howze with left, ultrasound guided, interscalene  block. Side rails up, monitors on throughout procedure. See vital signs in flow sheet. Tolerated Procedure well. 

## 2019-06-15 NOTE — Op Note (Signed)
NAME: Regina Walters, Regina Walters MEDICAL RECORD PT:[id number] ACCOUNT [id number] DATE OF BIRTH:11-25-1945 FACILITY: [facility] LOCATION: [facility] PHYSICIAN:Petar Mucci L. Nielle Duford, MD  OPERATIVE REPORT  DATE OF PROCEDURE:  06/09/2019  PREOPERATIVE DIAGNOSIS:  Displaced proximal humerus fracture, left.  POSTOPERATIVE DIAGNOSES:   1.  Displaced proximal humerus fracture, left.   2.  Rotator cuff tear created for treatment of proximal humerus fracture.  PRINCIPAL PROCEDURES: 1.  Intramedullary rod fixation of left proximal humerus fracture with locking screws proximally and distally.   2.  Repair of rotator cuff tear with side-to-side repair using FiberWire suture. 3.  Interpretation of multiple intraoperative fluoroscopic images.  SURGEON:  Dorna Leitz, MD  ASSISTANT:  Gaspar Skeeters, PA-C, was present for the entire case and assisted by manipulation of the arm, manipulation of tissues, and closing to minimize OR time.  BRIEF HISTORY:  The patient is a 73 year old female with a history of having fallen on her left shoulder.  She suffered a displaced proximal humerus fracture.  She was initially sent home by the emergency room and ultimately made it into my office.  She  was miserable at that point in a sling.  She was having tremendous pain.  We talked about treatment options including the possibility of persistent nonoperative treatment versus treatment with intramedullary rod because of the amount of pain she was  having difficulty with daily activities.  She elected to undergo open reduction internal fixation with intramedullary rod.  She was brought to the operating room for this procedure.  DESCRIPTION OF PROCEDURE:  The patient was brought to the operating room and after adequate anesthesia was obtained with a general anesthetic, the patient was placed supine on the operating table.  She was then moved into the beach chair position.  All  bony prominences well padded.  Attention was then turned to the  left shoulder where after routine prep and drape an incision was made proximally to gain access to the proximal fracture fragment.  A rotator cuff tear was created in the proximal humerus to  allow access to the proximal humerus.  This was done approximately 1 cm posterior to the bicipital groove.  Once this was done, a guidewire was placed and then examined under fluoroscopic imaging.  This guidewire appeared to be in the appropriate place  and a hole was created over the top of the guidewire to allow access to the proximal fragment.  Once this was done, a guidewire was placed across the fracture fragments and the fracture was sequentially reamed to a level of 10.5 mm to accept a 9 mm rod.   Once this reaming had been accomplished, the rod was placed and proximal locking bolts were placed.  We attempted manipulation to gain additional access from lower angle bolts but were not able to achieve fixation through these.  We ultimately made an  incision to try to help reduction and achieve greater fixation, but were not able to do this as well.  Once this was done, we manipulated the fracture fragments and to the best alignment we could get and ultimately locked the distal area of the humerus  with 2 locking screws front to back.  Once this was completed, the wounds were copiously and thoroughly irrigated and attention was turned to the proximal wound.    The rotator cuff tear was identified at this point and FiberWire suture was used and multiple figure-of-eight configurations to close the rotator cuff tear.  Final fluoroscopic images were taken at this point showing adequate alignment of  the fracture.   Now that this was completed, the wounds were closed in layers and sterile compressive dressing was applied throughout the arm and patient taken to the recovery room where she was noted to be in satisfactory condition.  The estimated blood loss for the  procedure was approximately 250 mL, but that final lumbar  be gotten from the anesthetic record.  Of note, Gus Puma was present for the entire case and assisted by manipulation of the arm, retraction of tissues, and closing to minimize OR time.   Fluoroscopy was used to constantly throughout the case to ensure adequate positioning of the rod and screws.  TN/NUANCE  D:06/15/2019 T:06/15/2019 JOB:[id number]

## 2019-06-22 ENCOUNTER — Encounter: Payer: Self-pay | Admitting: *Deleted

## 2019-06-27 DIAGNOSIS — S42202A Unspecified fracture of upper end of left humerus, initial encounter for closed fracture: Secondary | ICD-10-CM | POA: Diagnosis not present

## 2019-07-04 DIAGNOSIS — S42202D Unspecified fracture of upper end of left humerus, subsequent encounter for fracture with routine healing: Secondary | ICD-10-CM | POA: Diagnosis not present

## 2019-07-04 DIAGNOSIS — M25622 Stiffness of left elbow, not elsewhere classified: Secondary | ICD-10-CM | POA: Diagnosis not present

## 2019-07-04 DIAGNOSIS — S46012D Strain of muscle(s) and tendon(s) of the rotator cuff of left shoulder, subsequent encounter: Secondary | ICD-10-CM | POA: Diagnosis not present

## 2019-07-04 DIAGNOSIS — M25612 Stiffness of left shoulder, not elsewhere classified: Secondary | ICD-10-CM | POA: Diagnosis not present

## 2019-07-05 DIAGNOSIS — S42202D Unspecified fracture of upper end of left humerus, subsequent encounter for fracture with routine healing: Secondary | ICD-10-CM | POA: Diagnosis not present

## 2019-07-05 DIAGNOSIS — S46012D Strain of muscle(s) and tendon(s) of the rotator cuff of left shoulder, subsequent encounter: Secondary | ICD-10-CM | POA: Diagnosis not present

## 2019-07-05 DIAGNOSIS — M25612 Stiffness of left shoulder, not elsewhere classified: Secondary | ICD-10-CM | POA: Diagnosis not present

## 2019-07-05 DIAGNOSIS — M25622 Stiffness of left elbow, not elsewhere classified: Secondary | ICD-10-CM | POA: Diagnosis not present

## 2019-07-10 DIAGNOSIS — S46012D Strain of muscle(s) and tendon(s) of the rotator cuff of left shoulder, subsequent encounter: Secondary | ICD-10-CM | POA: Diagnosis not present

## 2019-07-10 DIAGNOSIS — S42202D Unspecified fracture of upper end of left humerus, subsequent encounter for fracture with routine healing: Secondary | ICD-10-CM | POA: Diagnosis not present

## 2019-07-10 DIAGNOSIS — M25612 Stiffness of left shoulder, not elsewhere classified: Secondary | ICD-10-CM | POA: Diagnosis not present

## 2019-07-10 DIAGNOSIS — M25622 Stiffness of left elbow, not elsewhere classified: Secondary | ICD-10-CM | POA: Diagnosis not present

## 2019-07-11 DIAGNOSIS — Z9889 Other specified postprocedural states: Secondary | ICD-10-CM | POA: Diagnosis not present

## 2019-07-12 DIAGNOSIS — M25622 Stiffness of left elbow, not elsewhere classified: Secondary | ICD-10-CM | POA: Diagnosis not present

## 2019-07-12 DIAGNOSIS — M25612 Stiffness of left shoulder, not elsewhere classified: Secondary | ICD-10-CM | POA: Diagnosis not present

## 2019-07-12 DIAGNOSIS — S46012D Strain of muscle(s) and tendon(s) of the rotator cuff of left shoulder, subsequent encounter: Secondary | ICD-10-CM | POA: Diagnosis not present

## 2019-07-12 DIAGNOSIS — S42202D Unspecified fracture of upper end of left humerus, subsequent encounter for fracture with routine healing: Secondary | ICD-10-CM | POA: Diagnosis not present

## 2019-07-17 DIAGNOSIS — M25622 Stiffness of left elbow, not elsewhere classified: Secondary | ICD-10-CM | POA: Diagnosis not present

## 2019-07-17 DIAGNOSIS — M25612 Stiffness of left shoulder, not elsewhere classified: Secondary | ICD-10-CM | POA: Diagnosis not present

## 2019-07-17 DIAGNOSIS — S46012D Strain of muscle(s) and tendon(s) of the rotator cuff of left shoulder, subsequent encounter: Secondary | ICD-10-CM | POA: Diagnosis not present

## 2019-07-17 DIAGNOSIS — S42202D Unspecified fracture of upper end of left humerus, subsequent encounter for fracture with routine healing: Secondary | ICD-10-CM | POA: Diagnosis not present

## 2019-07-19 DIAGNOSIS — S46012D Strain of muscle(s) and tendon(s) of the rotator cuff of left shoulder, subsequent encounter: Secondary | ICD-10-CM | POA: Diagnosis not present

## 2019-07-19 DIAGNOSIS — M25612 Stiffness of left shoulder, not elsewhere classified: Secondary | ICD-10-CM | POA: Diagnosis not present

## 2019-07-19 DIAGNOSIS — S42202D Unspecified fracture of upper end of left humerus, subsequent encounter for fracture with routine healing: Secondary | ICD-10-CM | POA: Diagnosis not present

## 2019-07-19 DIAGNOSIS — M25622 Stiffness of left elbow, not elsewhere classified: Secondary | ICD-10-CM | POA: Diagnosis not present

## 2019-07-25 DIAGNOSIS — S42202D Unspecified fracture of upper end of left humerus, subsequent encounter for fracture with routine healing: Secondary | ICD-10-CM | POA: Diagnosis not present

## 2019-07-25 DIAGNOSIS — M25612 Stiffness of left shoulder, not elsewhere classified: Secondary | ICD-10-CM | POA: Diagnosis not present

## 2019-07-25 DIAGNOSIS — M25622 Stiffness of left elbow, not elsewhere classified: Secondary | ICD-10-CM | POA: Diagnosis not present

## 2019-07-25 DIAGNOSIS — S46012D Strain of muscle(s) and tendon(s) of the rotator cuff of left shoulder, subsequent encounter: Secondary | ICD-10-CM | POA: Diagnosis not present

## 2019-07-27 DIAGNOSIS — S46012D Strain of muscle(s) and tendon(s) of the rotator cuff of left shoulder, subsequent encounter: Secondary | ICD-10-CM | POA: Diagnosis not present

## 2019-07-27 DIAGNOSIS — M25612 Stiffness of left shoulder, not elsewhere classified: Secondary | ICD-10-CM | POA: Diagnosis not present

## 2019-07-27 DIAGNOSIS — S42202D Unspecified fracture of upper end of left humerus, subsequent encounter for fracture with routine healing: Secondary | ICD-10-CM | POA: Diagnosis not present

## 2019-07-27 DIAGNOSIS — M25622 Stiffness of left elbow, not elsewhere classified: Secondary | ICD-10-CM | POA: Diagnosis not present

## 2019-08-01 DIAGNOSIS — S42202D Unspecified fracture of upper end of left humerus, subsequent encounter for fracture with routine healing: Secondary | ICD-10-CM | POA: Diagnosis not present

## 2019-08-01 DIAGNOSIS — Z9889 Other specified postprocedural states: Secondary | ICD-10-CM | POA: Diagnosis not present

## 2019-08-01 DIAGNOSIS — M25622 Stiffness of left elbow, not elsewhere classified: Secondary | ICD-10-CM | POA: Diagnosis not present

## 2019-08-01 DIAGNOSIS — M25612 Stiffness of left shoulder, not elsewhere classified: Secondary | ICD-10-CM | POA: Diagnosis not present

## 2019-08-01 DIAGNOSIS — S46012D Strain of muscle(s) and tendon(s) of the rotator cuff of left shoulder, subsequent encounter: Secondary | ICD-10-CM | POA: Diagnosis not present

## 2019-08-03 DIAGNOSIS — S42202D Unspecified fracture of upper end of left humerus, subsequent encounter for fracture with routine healing: Secondary | ICD-10-CM | POA: Diagnosis not present

## 2019-08-03 DIAGNOSIS — M25612 Stiffness of left shoulder, not elsewhere classified: Secondary | ICD-10-CM | POA: Diagnosis not present

## 2019-08-03 DIAGNOSIS — M25622 Stiffness of left elbow, not elsewhere classified: Secondary | ICD-10-CM | POA: Diagnosis not present

## 2019-08-03 DIAGNOSIS — S46012D Strain of muscle(s) and tendon(s) of the rotator cuff of left shoulder, subsequent encounter: Secondary | ICD-10-CM | POA: Diagnosis not present

## 2019-08-05 ENCOUNTER — Ambulatory Visit: Payer: PPO

## 2019-08-08 DIAGNOSIS — S46012D Strain of muscle(s) and tendon(s) of the rotator cuff of left shoulder, subsequent encounter: Secondary | ICD-10-CM | POA: Diagnosis not present

## 2019-08-08 DIAGNOSIS — M25622 Stiffness of left elbow, not elsewhere classified: Secondary | ICD-10-CM | POA: Diagnosis not present

## 2019-08-08 DIAGNOSIS — S42202D Unspecified fracture of upper end of left humerus, subsequent encounter for fracture with routine healing: Secondary | ICD-10-CM | POA: Diagnosis not present

## 2019-08-08 DIAGNOSIS — M25612 Stiffness of left shoulder, not elsewhere classified: Secondary | ICD-10-CM | POA: Diagnosis not present

## 2019-08-10 ENCOUNTER — Ambulatory Visit: Payer: PPO

## 2019-08-10 DIAGNOSIS — S42202D Unspecified fracture of upper end of left humerus, subsequent encounter for fracture with routine healing: Secondary | ICD-10-CM | POA: Diagnosis not present

## 2019-08-10 DIAGNOSIS — S46012D Strain of muscle(s) and tendon(s) of the rotator cuff of left shoulder, subsequent encounter: Secondary | ICD-10-CM | POA: Diagnosis not present

## 2019-08-10 DIAGNOSIS — M25612 Stiffness of left shoulder, not elsewhere classified: Secondary | ICD-10-CM | POA: Diagnosis not present

## 2019-08-10 DIAGNOSIS — M25622 Stiffness of left elbow, not elsewhere classified: Secondary | ICD-10-CM | POA: Diagnosis not present

## 2019-08-17 DIAGNOSIS — S46012D Strain of muscle(s) and tendon(s) of the rotator cuff of left shoulder, subsequent encounter: Secondary | ICD-10-CM | POA: Diagnosis not present

## 2019-08-17 DIAGNOSIS — S42202D Unspecified fracture of upper end of left humerus, subsequent encounter for fracture with routine healing: Secondary | ICD-10-CM | POA: Diagnosis not present

## 2019-08-17 DIAGNOSIS — M25612 Stiffness of left shoulder, not elsewhere classified: Secondary | ICD-10-CM | POA: Diagnosis not present

## 2019-08-17 DIAGNOSIS — M25622 Stiffness of left elbow, not elsewhere classified: Secondary | ICD-10-CM | POA: Diagnosis not present

## 2019-08-29 DIAGNOSIS — M25622 Stiffness of left elbow, not elsewhere classified: Secondary | ICD-10-CM | POA: Diagnosis not present

## 2019-08-29 DIAGNOSIS — S42202D Unspecified fracture of upper end of left humerus, subsequent encounter for fracture with routine healing: Secondary | ICD-10-CM | POA: Diagnosis not present

## 2019-08-29 DIAGNOSIS — S46012D Strain of muscle(s) and tendon(s) of the rotator cuff of left shoulder, subsequent encounter: Secondary | ICD-10-CM | POA: Diagnosis not present

## 2019-08-29 DIAGNOSIS — M25612 Stiffness of left shoulder, not elsewhere classified: Secondary | ICD-10-CM | POA: Diagnosis not present

## 2019-08-29 DIAGNOSIS — Z9889 Other specified postprocedural states: Secondary | ICD-10-CM | POA: Diagnosis not present

## 2019-08-31 DIAGNOSIS — M25622 Stiffness of left elbow, not elsewhere classified: Secondary | ICD-10-CM | POA: Diagnosis not present

## 2019-08-31 DIAGNOSIS — S42202D Unspecified fracture of upper end of left humerus, subsequent encounter for fracture with routine healing: Secondary | ICD-10-CM | POA: Diagnosis not present

## 2019-08-31 DIAGNOSIS — M25612 Stiffness of left shoulder, not elsewhere classified: Secondary | ICD-10-CM | POA: Diagnosis not present

## 2019-08-31 DIAGNOSIS — S46012D Strain of muscle(s) and tendon(s) of the rotator cuff of left shoulder, subsequent encounter: Secondary | ICD-10-CM | POA: Diagnosis not present

## 2019-09-05 DIAGNOSIS — M25612 Stiffness of left shoulder, not elsewhere classified: Secondary | ICD-10-CM | POA: Diagnosis not present

## 2019-09-05 DIAGNOSIS — S46012D Strain of muscle(s) and tendon(s) of the rotator cuff of left shoulder, subsequent encounter: Secondary | ICD-10-CM | POA: Diagnosis not present

## 2019-09-05 DIAGNOSIS — S42202D Unspecified fracture of upper end of left humerus, subsequent encounter for fracture with routine healing: Secondary | ICD-10-CM | POA: Diagnosis not present

## 2019-09-05 DIAGNOSIS — M25622 Stiffness of left elbow, not elsewhere classified: Secondary | ICD-10-CM | POA: Diagnosis not present

## 2019-09-07 DIAGNOSIS — M25622 Stiffness of left elbow, not elsewhere classified: Secondary | ICD-10-CM | POA: Diagnosis not present

## 2019-09-07 DIAGNOSIS — S42202D Unspecified fracture of upper end of left humerus, subsequent encounter for fracture with routine healing: Secondary | ICD-10-CM | POA: Diagnosis not present

## 2019-09-07 DIAGNOSIS — S46012D Strain of muscle(s) and tendon(s) of the rotator cuff of left shoulder, subsequent encounter: Secondary | ICD-10-CM | POA: Diagnosis not present

## 2019-09-07 DIAGNOSIS — M25612 Stiffness of left shoulder, not elsewhere classified: Secondary | ICD-10-CM | POA: Diagnosis not present

## 2019-09-12 DIAGNOSIS — M25622 Stiffness of left elbow, not elsewhere classified: Secondary | ICD-10-CM | POA: Diagnosis not present

## 2019-09-12 DIAGNOSIS — S42202D Unspecified fracture of upper end of left humerus, subsequent encounter for fracture with routine healing: Secondary | ICD-10-CM | POA: Diagnosis not present

## 2019-09-12 DIAGNOSIS — M25612 Stiffness of left shoulder, not elsewhere classified: Secondary | ICD-10-CM | POA: Diagnosis not present

## 2019-09-12 DIAGNOSIS — S46012D Strain of muscle(s) and tendon(s) of the rotator cuff of left shoulder, subsequent encounter: Secondary | ICD-10-CM | POA: Diagnosis not present

## 2019-09-14 DIAGNOSIS — M25622 Stiffness of left elbow, not elsewhere classified: Secondary | ICD-10-CM | POA: Diagnosis not present

## 2019-09-14 DIAGNOSIS — M25612 Stiffness of left shoulder, not elsewhere classified: Secondary | ICD-10-CM | POA: Diagnosis not present

## 2019-09-14 DIAGNOSIS — S46012D Strain of muscle(s) and tendon(s) of the rotator cuff of left shoulder, subsequent encounter: Secondary | ICD-10-CM | POA: Diagnosis not present

## 2019-09-14 DIAGNOSIS — S42202D Unspecified fracture of upper end of left humerus, subsequent encounter for fracture with routine healing: Secondary | ICD-10-CM | POA: Diagnosis not present

## 2019-09-19 DIAGNOSIS — M25622 Stiffness of left elbow, not elsewhere classified: Secondary | ICD-10-CM | POA: Diagnosis not present

## 2019-09-19 DIAGNOSIS — M25612 Stiffness of left shoulder, not elsewhere classified: Secondary | ICD-10-CM | POA: Diagnosis not present

## 2019-09-19 DIAGNOSIS — S46012D Strain of muscle(s) and tendon(s) of the rotator cuff of left shoulder, subsequent encounter: Secondary | ICD-10-CM | POA: Diagnosis not present

## 2019-09-19 DIAGNOSIS — S42202D Unspecified fracture of upper end of left humerus, subsequent encounter for fracture with routine healing: Secondary | ICD-10-CM | POA: Diagnosis not present

## 2019-09-20 ENCOUNTER — Other Ambulatory Visit: Payer: Self-pay | Admitting: Family Medicine

## 2019-09-20 DIAGNOSIS — Z13820 Encounter for screening for osteoporosis: Secondary | ICD-10-CM

## 2019-09-21 DIAGNOSIS — S42202D Unspecified fracture of upper end of left humerus, subsequent encounter for fracture with routine healing: Secondary | ICD-10-CM | POA: Diagnosis not present

## 2019-09-21 DIAGNOSIS — M25612 Stiffness of left shoulder, not elsewhere classified: Secondary | ICD-10-CM | POA: Diagnosis not present

## 2019-09-21 DIAGNOSIS — M25622 Stiffness of left elbow, not elsewhere classified: Secondary | ICD-10-CM | POA: Diagnosis not present

## 2019-09-21 DIAGNOSIS — S46012D Strain of muscle(s) and tendon(s) of the rotator cuff of left shoulder, subsequent encounter: Secondary | ICD-10-CM | POA: Diagnosis not present

## 2019-09-26 DIAGNOSIS — S42202D Unspecified fracture of upper end of left humerus, subsequent encounter for fracture with routine healing: Secondary | ICD-10-CM | POA: Diagnosis not present

## 2019-09-26 DIAGNOSIS — S46012D Strain of muscle(s) and tendon(s) of the rotator cuff of left shoulder, subsequent encounter: Secondary | ICD-10-CM | POA: Diagnosis not present

## 2019-09-26 DIAGNOSIS — M25622 Stiffness of left elbow, not elsewhere classified: Secondary | ICD-10-CM | POA: Diagnosis not present

## 2019-09-26 DIAGNOSIS — M25612 Stiffness of left shoulder, not elsewhere classified: Secondary | ICD-10-CM | POA: Diagnosis not present

## 2019-09-26 DIAGNOSIS — M67912 Unspecified disorder of synovium and tendon, left shoulder: Secondary | ICD-10-CM | POA: Diagnosis not present

## 2019-09-27 DIAGNOSIS — R000 Tachycardia, unspecified: Secondary | ICD-10-CM | POA: Diagnosis not present

## 2019-09-27 LAB — CBC: RBC: 4.79 (ref 3.87–5.11)

## 2019-09-27 LAB — CBC AND DIFFERENTIAL
HCT: 39 (ref 36–46)
Hemoglobin: 12.9 (ref 12.0–16.0)
Platelets: 272 (ref 150–399)
WBC: 12.8

## 2019-09-28 DIAGNOSIS — S42202D Unspecified fracture of upper end of left humerus, subsequent encounter for fracture with routine healing: Secondary | ICD-10-CM | POA: Diagnosis not present

## 2019-09-28 DIAGNOSIS — M25622 Stiffness of left elbow, not elsewhere classified: Secondary | ICD-10-CM | POA: Diagnosis not present

## 2019-09-28 DIAGNOSIS — M25612 Stiffness of left shoulder, not elsewhere classified: Secondary | ICD-10-CM | POA: Diagnosis not present

## 2019-09-28 DIAGNOSIS — S46012D Strain of muscle(s) and tendon(s) of the rotator cuff of left shoulder, subsequent encounter: Secondary | ICD-10-CM | POA: Diagnosis not present

## 2019-10-03 DIAGNOSIS — M25622 Stiffness of left elbow, not elsewhere classified: Secondary | ICD-10-CM | POA: Diagnosis not present

## 2019-10-03 DIAGNOSIS — S46012D Strain of muscle(s) and tendon(s) of the rotator cuff of left shoulder, subsequent encounter: Secondary | ICD-10-CM | POA: Diagnosis not present

## 2019-10-03 DIAGNOSIS — M25612 Stiffness of left shoulder, not elsewhere classified: Secondary | ICD-10-CM | POA: Diagnosis not present

## 2019-10-03 DIAGNOSIS — S42202D Unspecified fracture of upper end of left humerus, subsequent encounter for fracture with routine healing: Secondary | ICD-10-CM | POA: Diagnosis not present

## 2019-10-05 DIAGNOSIS — M25622 Stiffness of left elbow, not elsewhere classified: Secondary | ICD-10-CM | POA: Diagnosis not present

## 2019-10-05 DIAGNOSIS — S46012D Strain of muscle(s) and tendon(s) of the rotator cuff of left shoulder, subsequent encounter: Secondary | ICD-10-CM | POA: Diagnosis not present

## 2019-10-05 DIAGNOSIS — M25612 Stiffness of left shoulder, not elsewhere classified: Secondary | ICD-10-CM | POA: Diagnosis not present

## 2019-10-05 DIAGNOSIS — S42202D Unspecified fracture of upper end of left humerus, subsequent encounter for fracture with routine healing: Secondary | ICD-10-CM | POA: Diagnosis not present

## 2019-10-10 DIAGNOSIS — M25622 Stiffness of left elbow, not elsewhere classified: Secondary | ICD-10-CM | POA: Diagnosis not present

## 2019-10-10 DIAGNOSIS — S42202D Unspecified fracture of upper end of left humerus, subsequent encounter for fracture with routine healing: Secondary | ICD-10-CM | POA: Diagnosis not present

## 2019-10-10 DIAGNOSIS — S46012D Strain of muscle(s) and tendon(s) of the rotator cuff of left shoulder, subsequent encounter: Secondary | ICD-10-CM | POA: Diagnosis not present

## 2019-10-10 DIAGNOSIS — M25612 Stiffness of left shoulder, not elsewhere classified: Secondary | ICD-10-CM | POA: Diagnosis not present

## 2019-10-11 ENCOUNTER — Other Ambulatory Visit: Payer: Self-pay

## 2019-10-11 ENCOUNTER — Ambulatory Visit (INDEPENDENT_AMBULATORY_CARE_PROVIDER_SITE_OTHER): Payer: PPO | Admitting: Physician Assistant

## 2019-10-11 ENCOUNTER — Encounter: Payer: Self-pay | Admitting: Physician Assistant

## 2019-10-11 VITALS — BP 192/75 | HR 90 | Wt 126.0 lb

## 2019-10-11 DIAGNOSIS — R030 Elevated blood-pressure reading, without diagnosis of hypertension: Secondary | ICD-10-CM | POA: Insufficient documentation

## 2019-10-11 DIAGNOSIS — R221 Localized swelling, mass and lump, neck: Secondary | ICD-10-CM

## 2019-10-11 DIAGNOSIS — R011 Cardiac murmur, unspecified: Secondary | ICD-10-CM

## 2019-10-11 DIAGNOSIS — R0989 Other specified symptoms and signs involving the circulatory and respiratory systems: Secondary | ICD-10-CM | POA: Insufficient documentation

## 2019-10-11 DIAGNOSIS — E782 Mixed hyperlipidemia: Secondary | ICD-10-CM | POA: Diagnosis not present

## 2019-10-11 DIAGNOSIS — K219 Gastro-esophageal reflux disease without esophagitis: Secondary | ICD-10-CM

## 2019-10-11 DIAGNOSIS — Z1211 Encounter for screening for malignant neoplasm of colon: Secondary | ICD-10-CM

## 2019-10-11 NOTE — Progress Notes (Signed)
New Patient Office Visit  Subjective:  Patient ID: GUY TONEY, female    DOB: 1945-09-08  Age: 74 y.o. MRN: [id number]  CC: No chief complaint on file.   HPI Regina Walters presents to establish care and discuss pulsing neck mass on right side. She has noticed this for 1 year. No pain and not getting worse. She just sees and notices such a strong pulse on the neck. She denies any headaches, vision changes, SOB, cough, CP, neck pain. She has a known murmur. No peripheral swelling. She mentioned this to her previous provider and per patient said he didn't really look at her.   Past Medical History:  Diagnosis Date  . History of kidney stones   . Kidney stone on left side 2014    Past Surgical History:  Procedure Laterality Date  . LAPAROSCOPIC CHOLECYSTECTOMY SINGLE SITE WITH INTRAOPERATIVE CHOLANGIOGRAM N/A 04/21/2019   Procedure: LAPAROSCOPIC CHOLECYSTECTOMY SINGLE SITE WITH INTRAOPERATIVE CHOLANGIOGRAM;  Surgeon: Karie Soda, MD;  Location: [facility];  Service: General;  Laterality: N/A;  . ORIF HUMERUS FRACTURE Left 06/09/2019   Procedure: [id number] NAILING OF LEFT PROXIMAL HUMERUS;  Surgeon: Jodi Geralds, MD;  Location: [facility];  Service: Orthopedics;  Laterality: Left;  . TONSILLECTOMY      Family History  Problem Relation Age of Onset  . AAA (abdominal aortic aneurysm) Mother   . COPD Mother   . Lymphoma Father   . Hypertension Other   . Asthma Other     Social History   Socioeconomic History  . Marital status: Married    Spouse name: Not on file  . Number of children: Not on file  . Years of education: Not on file  . Highest education level: Not on file  Occupational History  . Not on file  Tobacco Use  . Smoking status: Never Smoker  . Smokeless tobacco: Never Used  Substance and Sexual Activity  . Alcohol use: No  . Drug use: No  . Sexual activity: Not Currently    Partners: Male  Other Topics Concern  . Not on file  Social History  Narrative  . Not on file   Social Determinants of Health   Financial Resource Strain:   . Difficulty of Paying Living Expenses:   Food Insecurity:   . Worried About Programme researcher, broadcasting/film/video in the Last Year:   . Barista in the Last Year:   Transportation Needs:   . Freight forwarder (Medical):   Marland Kitchen Lack of Transportation (Non-Medical):   Physical Activity:   . Days of Exercise per Week:   . Minutes of Exercise per Session:   Stress:   . Feeling of Stress :   Social Connections:   . Frequency of Communication with Friends and Family:   . Frequency of Social Gatherings with Friends and Family:   . Attends Religious Services:   . Active Member of Clubs or Organizations:   . Attends Banker Meetings:   Marland Kitchen Marital Status:   Intimate Partner Violence:   . Fear of Current or Ex-Partner:   . Emotionally Abused:   Marland Kitchen Physically Abused:   . Sexually Abused:     ROS Review of Systems  All other systems reviewed and are negative.   Objective:   Today's Vitals: BP (!) 192/75   Pulse 90   Wt 126 lb (57.2 kg)   BMI 26.33 kg/m   Physical Exam Vitals reviewed.  Constitutional:  Appearance: Normal appearance.  Neck:     Vascular: Carotid bruit present.     Comments: Right carotid bruit with obvious strong carotid pulse. No redness or tenderness.  Cardiovascular:     Rate and Rhythm: Normal rate and regular rhythm.     Pulses: Normal pulses.     Heart sounds: Murmur present.  Pulmonary:     Effort: Pulmonary effort is normal.     Breath sounds: Normal breath sounds.  Neurological:     General: No focal deficit present.     Mental Status: She is alert and oriented to person, place, and time.  Psychiatric:        Mood and Affect: Mood normal.      .. Depression screen [facility] 2/9 10/11/2019  Decreased Interest 0  Down, Depressed, Hopeless 0  PHQ - 2 Score 0  Altered sleeping 2  Tired, decreased energy 0  Change in appetite 0  Feeling bad or  failure about yourself  0  Trouble concentrating 0  Moving slowly or fidgety/restless 0  Suicidal thoughts 0  PHQ-9 Score 2  Difficult doing work/chores Not difficult at all   .Marland Kitchen GAD 7 : Generalized Anxiety Score 10/11/2019  Nervous, Anxious, on Edge 0  Control/stop worrying 0  Worry too much - different things 0  Trouble relaxing 0  Restless 0  Easily annoyed or irritable 0  Afraid - awful might happen 0  Total GAD 7 Score 0  Anxiety Difficulty Not difficult at all     Assessment & Plan:  Marland KitchenMarland KitchenDiagnoses and all orders for this visit:  Right carotid bruit -     US Carotid Duplex Right  Gastroesophageal reflux disease, unspecified whether esophagitis present  Elevated blood pressure reading without diagnosis of hypertension -     US Carotid Duplex Right  Colon cancer screening -     Cologuard  Pulsatile neck mass  Mixed hyperlipidemia  Systolic murmur   She has a very prominent what looks like right carotid pulse. Bruit heard over this area. Will get carotid doppler. Had for over a year. No pain associated.   Her BP is very up today but she checks at home and always in the 130-150 over 60-70s. Continue to monitor at home.   Pts mother did have AAA but she was a smoker. Discussed AAA screening patient will consider in future.   Declines mammogram, pneumonia vaccine, shingles vaccine, colonoscopy. She just "does not want to do it". Pt is aware of risk. She is ok with cologuard being ordered.   She reports labs have been done. Will get records and make suggestion.   Treats GERD naturally. Controlled today.      Follow-up: Return if symptoms worsen or fail to improve.   Iran Planas, PA-C

## 2019-10-11 NOTE — Patient Instructions (Signed)
Will get carotid doppler.

## 2019-10-12 DIAGNOSIS — S42202D Unspecified fracture of upper end of left humerus, subsequent encounter for fracture with routine healing: Secondary | ICD-10-CM | POA: Diagnosis not present

## 2019-10-12 DIAGNOSIS — M25622 Stiffness of left elbow, not elsewhere classified: Secondary | ICD-10-CM | POA: Diagnosis not present

## 2019-10-12 DIAGNOSIS — M25612 Stiffness of left shoulder, not elsewhere classified: Secondary | ICD-10-CM | POA: Diagnosis not present

## 2019-10-12 DIAGNOSIS — S46012D Strain of muscle(s) and tendon(s) of the rotator cuff of left shoulder, subsequent encounter: Secondary | ICD-10-CM | POA: Diagnosis not present

## 2019-10-13 ENCOUNTER — Encounter: Payer: Self-pay | Admitting: Physician Assistant

## 2019-10-13 DIAGNOSIS — R011 Cardiac murmur, unspecified: Secondary | ICD-10-CM | POA: Insufficient documentation

## 2019-10-13 DIAGNOSIS — E782 Mixed hyperlipidemia: Secondary | ICD-10-CM | POA: Insufficient documentation

## 2019-10-13 DIAGNOSIS — R221 Localized swelling, mass and lump, neck: Secondary | ICD-10-CM | POA: Insufficient documentation

## 2019-10-16 ENCOUNTER — Ambulatory Visit (HOSPITAL_BASED_OUTPATIENT_CLINIC_OR_DEPARTMENT_OTHER)
Admission: RE | Admit: 2019-10-16 | Discharge: 2019-10-16 | Disposition: A | Discharge status: Home / Self Care | Payer: PPO | Source: Ambulatory Visit | Attending: Physician Assistant | Admitting: Physician Assistant

## 2019-10-16 ENCOUNTER — Other Ambulatory Visit: Payer: Self-pay

## 2019-10-16 DIAGNOSIS — R0989 Other specified symptoms and signs involving the circulatory and respiratory systems: Secondary | ICD-10-CM | POA: Diagnosis not present

## 2019-10-16 DIAGNOSIS — R030 Elevated blood-pressure reading, without diagnosis of hypertension: Secondary | ICD-10-CM | POA: Diagnosis not present

## 2019-10-16 DIAGNOSIS — I6523 Occlusion and stenosis of bilateral carotid arteries: Secondary | ICD-10-CM | POA: Diagnosis not present

## 2019-10-17 DIAGNOSIS — M25612 Stiffness of left shoulder, not elsewhere classified: Secondary | ICD-10-CM | POA: Diagnosis not present

## 2019-10-17 DIAGNOSIS — M7502 Adhesive capsulitis of left shoulder: Secondary | ICD-10-CM | POA: Diagnosis not present

## 2019-10-17 DIAGNOSIS — S42202D Unspecified fracture of upper end of left humerus, subsequent encounter for fracture with routine healing: Secondary | ICD-10-CM | POA: Diagnosis not present

## 2019-10-17 DIAGNOSIS — S46012D Strain of muscle(s) and tendon(s) of the rotator cuff of left shoulder, subsequent encounter: Secondary | ICD-10-CM | POA: Diagnosis not present

## 2019-10-17 DIAGNOSIS — M25622 Stiffness of left elbow, not elsewhere classified: Secondary | ICD-10-CM | POA: Diagnosis not present

## 2019-10-18 NOTE — Progress Notes (Signed)
No significant plaque accumulation. No masses. If patient were symptomatic with SOB or swelling I would get echo to evaluate. Discussed with metheney as well and agreed with plan.

## 2019-10-19 DIAGNOSIS — S42202D Unspecified fracture of upper end of left humerus, subsequent encounter for fracture with routine healing: Secondary | ICD-10-CM | POA: Diagnosis not present

## 2019-10-19 DIAGNOSIS — S46012D Strain of muscle(s) and tendon(s) of the rotator cuff of left shoulder, subsequent encounter: Secondary | ICD-10-CM | POA: Diagnosis not present

## 2019-10-19 DIAGNOSIS — M25622 Stiffness of left elbow, not elsewhere classified: Secondary | ICD-10-CM | POA: Diagnosis not present

## 2019-10-19 DIAGNOSIS — M25612 Stiffness of left shoulder, not elsewhere classified: Secondary | ICD-10-CM | POA: Diagnosis not present

## 2019-10-24 DIAGNOSIS — M25622 Stiffness of left elbow, not elsewhere classified: Secondary | ICD-10-CM | POA: Diagnosis not present

## 2019-10-24 DIAGNOSIS — M25612 Stiffness of left shoulder, not elsewhere classified: Secondary | ICD-10-CM | POA: Diagnosis not present

## 2019-10-24 DIAGNOSIS — S42202D Unspecified fracture of upper end of left humerus, subsequent encounter for fracture with routine healing: Secondary | ICD-10-CM | POA: Diagnosis not present

## 2019-10-24 DIAGNOSIS — S46012D Strain of muscle(s) and tendon(s) of the rotator cuff of left shoulder, subsequent encounter: Secondary | ICD-10-CM | POA: Diagnosis not present

## 2019-10-26 DIAGNOSIS — M25612 Stiffness of left shoulder, not elsewhere classified: Secondary | ICD-10-CM | POA: Diagnosis not present

## 2019-10-26 DIAGNOSIS — M25622 Stiffness of left elbow, not elsewhere classified: Secondary | ICD-10-CM | POA: Diagnosis not present

## 2019-10-26 DIAGNOSIS — S42202D Unspecified fracture of upper end of left humerus, subsequent encounter for fracture with routine healing: Secondary | ICD-10-CM | POA: Diagnosis not present

## 2019-10-26 DIAGNOSIS — S46012D Strain of muscle(s) and tendon(s) of the rotator cuff of left shoulder, subsequent encounter: Secondary | ICD-10-CM | POA: Diagnosis not present

## 2019-10-31 DIAGNOSIS — S42202D Unspecified fracture of upper end of left humerus, subsequent encounter for fracture with routine healing: Secondary | ICD-10-CM | POA: Diagnosis not present

## 2019-10-31 DIAGNOSIS — S46012D Strain of muscle(s) and tendon(s) of the rotator cuff of left shoulder, subsequent encounter: Secondary | ICD-10-CM | POA: Diagnosis not present

## 2019-10-31 DIAGNOSIS — M25612 Stiffness of left shoulder, not elsewhere classified: Secondary | ICD-10-CM | POA: Diagnosis not present

## 2019-10-31 DIAGNOSIS — M25622 Stiffness of left elbow, not elsewhere classified: Secondary | ICD-10-CM | POA: Diagnosis not present

## 2019-11-02 DIAGNOSIS — S42202D Unspecified fracture of upper end of left humerus, subsequent encounter for fracture with routine healing: Secondary | ICD-10-CM | POA: Diagnosis not present

## 2019-11-02 DIAGNOSIS — S46012D Strain of muscle(s) and tendon(s) of the rotator cuff of left shoulder, subsequent encounter: Secondary | ICD-10-CM | POA: Diagnosis not present

## 2019-11-02 DIAGNOSIS — M25612 Stiffness of left shoulder, not elsewhere classified: Secondary | ICD-10-CM | POA: Diagnosis not present

## 2019-11-02 DIAGNOSIS — M25622 Stiffness of left elbow, not elsewhere classified: Secondary | ICD-10-CM | POA: Diagnosis not present

## 2019-11-30 DIAGNOSIS — S42202D Unspecified fracture of upper end of left humerus, subsequent encounter for fracture with routine healing: Secondary | ICD-10-CM | POA: Diagnosis not present

## 2019-11-30 DIAGNOSIS — S46012D Strain of muscle(s) and tendon(s) of the rotator cuff of left shoulder, subsequent encounter: Secondary | ICD-10-CM | POA: Diagnosis not present

## 2019-11-30 DIAGNOSIS — M25612 Stiffness of left shoulder, not elsewhere classified: Secondary | ICD-10-CM | POA: Diagnosis not present

## 2019-11-30 DIAGNOSIS — M25622 Stiffness of left elbow, not elsewhere classified: Secondary | ICD-10-CM | POA: Diagnosis not present

## 2019-12-05 DIAGNOSIS — M25612 Stiffness of left shoulder, not elsewhere classified: Secondary | ICD-10-CM | POA: Diagnosis not present

## 2019-12-05 DIAGNOSIS — M25622 Stiffness of left elbow, not elsewhere classified: Secondary | ICD-10-CM | POA: Diagnosis not present

## 2019-12-05 DIAGNOSIS — S46012D Strain of muscle(s) and tendon(s) of the rotator cuff of left shoulder, subsequent encounter: Secondary | ICD-10-CM | POA: Diagnosis not present

## 2019-12-05 DIAGNOSIS — S42202D Unspecified fracture of upper end of left humerus, subsequent encounter for fracture with routine healing: Secondary | ICD-10-CM | POA: Diagnosis not present

## 2019-12-06 ENCOUNTER — Ambulatory Visit
Admission: RE | Admit: 2019-12-06 | Discharge: 2019-12-06 | Disposition: A | Discharge status: Home / Self Care | Payer: PPO | Source: Ambulatory Visit | Attending: Family Medicine | Admitting: Family Medicine

## 2019-12-06 ENCOUNTER — Other Ambulatory Visit: Payer: Self-pay

## 2019-12-06 DIAGNOSIS — Z13820 Encounter for screening for osteoporosis: Secondary | ICD-10-CM

## 2019-12-06 DIAGNOSIS — Z780 Asymptomatic menopausal state: Secondary | ICD-10-CM | POA: Diagnosis not present

## 2019-12-06 DIAGNOSIS — M810 Age-related osteoporosis without current pathological fracture: Secondary | ICD-10-CM | POA: Diagnosis not present

## 2019-12-07 DIAGNOSIS — S46012D Strain of muscle(s) and tendon(s) of the rotator cuff of left shoulder, subsequent encounter: Secondary | ICD-10-CM | POA: Diagnosis not present

## 2019-12-07 DIAGNOSIS — M25622 Stiffness of left elbow, not elsewhere classified: Secondary | ICD-10-CM | POA: Diagnosis not present

## 2019-12-07 DIAGNOSIS — M25612 Stiffness of left shoulder, not elsewhere classified: Secondary | ICD-10-CM | POA: Diagnosis not present

## 2019-12-07 DIAGNOSIS — S42202D Unspecified fracture of upper end of left humerus, subsequent encounter for fracture with routine healing: Secondary | ICD-10-CM | POA: Diagnosis not present

## 2019-12-26 DIAGNOSIS — S46012D Strain of muscle(s) and tendon(s) of the rotator cuff of left shoulder, subsequent encounter: Secondary | ICD-10-CM | POA: Diagnosis not present

## 2019-12-26 DIAGNOSIS — M25622 Stiffness of left elbow, not elsewhere classified: Secondary | ICD-10-CM | POA: Diagnosis not present

## 2019-12-26 DIAGNOSIS — M25612 Stiffness of left shoulder, not elsewhere classified: Secondary | ICD-10-CM | POA: Diagnosis not present

## 2019-12-26 DIAGNOSIS — S42202D Unspecified fracture of upper end of left humerus, subsequent encounter for fracture with routine healing: Secondary | ICD-10-CM | POA: Diagnosis not present

## 2019-12-27 ENCOUNTER — Encounter: Payer: Self-pay | Admitting: Physician Assistant

## 2019-12-27 ENCOUNTER — Ambulatory Visit (INDEPENDENT_AMBULATORY_CARE_PROVIDER_SITE_OTHER): Payer: PPO | Admitting: Physician Assistant

## 2019-12-27 VITALS — BP 168/67 | HR 102 | Ht <= 58 in | Wt 125.0 lb

## 2019-12-27 DIAGNOSIS — Z131 Encounter for screening for diabetes mellitus: Secondary | ICD-10-CM | POA: Diagnosis not present

## 2019-12-27 DIAGNOSIS — R030 Elevated blood-pressure reading, without diagnosis of hypertension: Secondary | ICD-10-CM

## 2019-12-27 DIAGNOSIS — M8000XD Age-related osteoporosis with current pathological fracture, unspecified site, subsequent encounter for fracture with routine healing: Secondary | ICD-10-CM | POA: Diagnosis not present

## 2019-12-27 DIAGNOSIS — Z13220 Encounter for screening for lipoid disorders: Secondary | ICD-10-CM | POA: Diagnosis not present

## 2019-12-27 NOTE — Progress Notes (Signed)
   Subjective:    Patient ID: Regina Walters, female    DOB: Jan 27, 1946, 74 y.o.   MRN: [id number]  HPI  Patient is a 74 year old female with GERD and hyperlipidemia who presents to the clinic to go over her DEXA scan.  DEXA scan was recently done and she scored a -3.4.  She is on vitamin D and calcium.  She would like to discuss options for treatment.   .. Active Ambulatory Problems    Diagnosis Date Noted  . Acute on chronic cholecystitis s/p lap cholecystectomy 04/21/2019 04/10/2019  . Gastroesophageal reflux disease 04/10/2019  . Liver cyst 04/09/2013  . Displaced fracture of proximal end of left humerus 06/09/2019  . Elevated blood pressure reading without diagnosis of hypertension 10/11/2019  . Right carotid bruit 10/11/2019  . Pulsatile neck mass 10/13/2019  . Systolic murmur 10/13/2019  . Mixed hyperlipidemia 10/13/2019   Resolved Ambulatory Problems    Diagnosis Date Noted  . No Resolved Ambulatory Problems   Past Medical History:  Diagnosis Date  . History of kidney stones   . Kidney stone on left side 2014      Review of Systems  All other systems reviewed and are negative.      Objective:   Physical Exam Vitals reviewed.  Constitutional:      Appearance: Normal appearance.  Cardiovascular:     Rate and Rhythm: Normal rate and regular rhythm.     Pulses: Normal pulses.  Pulmonary:     Effort: Pulmonary effort is normal.     Breath sounds: Normal breath sounds.  Neurological:     General: No focal deficit present.     Mental Status: She is alert and oriented to person, place, and time.  Psychiatric:        Mood and Affect: Mood normal.           Assessment & Plan:  Marland KitchenMarland KitchenNahomy was seen today for osteoporosis.  Diagnoses and all orders for this visit:  Age-related osteoporosis with current pathological fracture with routine healing, subsequent encounter -     VITAMIN D 25 Hydroxy (Vit-D Deficiency, Fractures) -     COMPLETE METABOLIC  PANEL WITH GFR  Screening for diabetes mellitus  Screening for lipid disorders -     Lipid Panel w/reflex Direct LDL  Elevated blood pressure reading without diagnosis of hypertension   Discussed DEXA scan with patient. She is aware of osteoporosis and the things she can do to help bone density such as exercise/low weight training. Will order vitamin D. She is on calcium.  CMP ordered Discussed options of treatment. Suggested prolia every 6 months.  Will get ordered.   BP elevated today. Discussed she checks at home and always great. She gets nervous coming in here. She is reporting readings of 110s to 120s over 80s at home.

## 2019-12-27 NOTE — Patient Instructions (Addendum)
Denosumab injection What is this medicine? DENOSUMAB (den oh sue mab) slows bone breakdown. Prolia is used to treat osteoporosis in women after menopause and in men, and in people who are taking corticosteroids for 6 months or more. Xgeva is used to treat a high calcium level due to cancer and to prevent bone fractures and other bone problems caused by multiple myeloma or cancer bone metastases. Xgeva is also used to treat giant cell tumor of the bone. This medicine may be used for other purposes; ask your health care provider or pharmacist if you have questions. COMMON BRAND NAME(S): Prolia, XGEVA What should I tell my health care provider before I take this medicine? They need to know if you have any of these conditions:  dental disease  having surgery or tooth extraction  infection  kidney disease  low levels of calcium or Vitamin D in the blood  malnutrition  on hemodialysis  skin conditions or sensitivity  thyroid or parathyroid disease  an unusual reaction to denosumab, other medicines, foods, dyes, or preservatives  pregnant or trying to get pregnant  breast-feeding How should I use this medicine? This medicine is for injection under the skin. It is given by a health care professional in a hospital or clinic setting. A special MedGuide will be given to you before each treatment. Be sure to read this information carefully each time. For Prolia, talk to your pediatrician regarding the use of this medicine in children. Special care may be needed. For Xgeva, talk to your pediatrician regarding the use of this medicine in children. While this drug may be prescribed for children as young as 13 years for selected conditions, precautions do apply. Overdosage: If you think you have taken too much of this medicine contact a poison control center or emergency room at once. NOTE: This medicine is only for you. Do not share this medicine with others. What if I miss a dose? It is  important not to miss your dose. Call your doctor or health care professional if you are unable to keep an appointment. What may interact with this medicine? Do not take this medicine with any of the following medications:  other medicines containing denosumab This medicine may also interact with the following medications:  medicines that lower your chance of fighting infection  steroid medicines like prednisone or cortisone This list may not describe all possible interactions. Give your health care provider a list of all the medicines, herbs, non-prescription drugs, or dietary supplements you use. Also tell them if you smoke, drink alcohol, or use illegal drugs. Some items may interact with your medicine. What should I watch for while using this medicine? Visit your doctor or health care professional for regular checks on your progress. Your doctor or health care professional may order blood tests and other tests to see how you are doing. Call your doctor or health care professional for advice if you get a fever, chills or sore throat, or other symptoms of a cold or flu. Do not treat yourself. This drug may decrease your body's ability to fight infection. Try to avoid being around people who are sick. You should make sure you get enough calcium and vitamin D while you are taking this medicine, unless your doctor tells you not to. Discuss the foods you eat and the vitamins you take with your health care professional. See your dentist regularly. Brush and floss your teeth as directed. Before you have any dental work done, tell your dentist you are   receiving this medicine. Do not become pregnant while taking this medicine or for 5 months after stopping it. Talk with your doctor or health care professional about your birth control options while taking this medicine. Women should inform their doctor if they wish to become pregnant or think they might be pregnant. There is a potential for serious side  effects to an unborn child. Talk to your health care professional or pharmacist for more information. What side effects may I notice from receiving this medicine? Side effects that you should report to your doctor or health care professional as soon as possible:  allergic reactions like skin rash, itching or hives, swelling of the face, lips, or tongue  bone pain  breathing problems  dizziness  jaw pain, especially after dental work  redness, blistering, peeling of the skin  signs and symptoms of infection like fever or chills; cough; sore throat; pain or trouble passing urine  signs of low calcium like fast heartbeat, muscle cramps or muscle pain; pain, tingling, numbness in the hands or feet; seizures  unusual bleeding or bruising  unusually weak or tired Side effects that usually do not require medical attention (report to your doctor or health care professional if they continue or are bothersome):  constipation  diarrhea  headache  joint pain  loss of appetite  muscle pain  runny nose  tiredness  upset stomach This list may not describe all possible side effects. Call your doctor for medical advice about side effects. You may report side effects to FDA at 1-800-FDA-1088. Where should I keep my medicine? This medicine is only given in a clinic, doctor's office, or other health care setting and will not be stored at home. NOTE: This sheet is a summary. It may not cover all possible information. If you have questions about this medicine, talk to your doctor, pharmacist, or health care provider.  2020 Elsevier/Gold Standard (2017-10-29 16:10:44)  Osteoporosis  Osteoporosis is thinning and loss of density in your bones. Osteoporosis makes bones more brittle and fragile and more likely to break (fracture). Over time, osteoporosis can cause your bones to become so weak that they fracture after a minor fall. Bones in the hip, wrist, and spine are most likely to fracture  due to osteoporosis. What are the causes? The exact cause of this condition is not known. What increases the risk? You may be at greater risk for osteoporosis if you:  Have a family history of the condition.  Have poor nutrition.  Use steroid medicines, such as prednisone.  Are female.  Are age 32 or older.  Smoke or have a history of smoking.  Are not physically active (are sedentary).  Are white (Caucasian) or of Asian descent.  Have a small body frame.  Take certain medicines, such as antiseizure medicines. What are the signs or symptoms? A fracture might be the first sign of osteoporosis, especially if the fracture results from a fall or injury that usually would not cause a bone to break. Other signs and symptoms include:  Pain in the neck or low back.  Stooped posture.  Loss of height. How is this diagnosed? This condition may be diagnosed based on:  Your medical history.  A physical exam.  A bone mineral density test, also called a DXA or DEXA test (dual-energy X-ray absorptiometry test). This test uses X-rays to measure the amount of minerals in your bones. How is this treated? The goal of treatment is to strengthen your bones and lower your risk  for a fracture. Treatment may involve:  Making lifestyle changes, such as: ? Including foods with more calcium and vitamin D in your diet. ? Doing weight-bearing and muscle-strengthening exercises. ? Stopping tobacco use. ? Limiting alcohol intake.  Taking medicine to slow the process of bone loss or to increase bone density.  Taking daily supplements of calcium and vitamin D.  Taking hormone replacement medicines, such as estrogen for women and testosterone for men.  Monitoring your levels of calcium and vitamin D. Follow these instructions at home:  Activity  Exercise as told by your health care provider. Ask your health care provider what exercises and activities are safe for you. You should  do: ? Exercises that make you work against gravity (weight-bearing exercises), such as tai chi, yoga, or walking. ? Exercises to strengthen muscles, such as lifting weights. Lifestyle  Limit alcohol intake to no more than 1 drink a day for nonpregnant women and 2 drinks a day for men. One drink equals 12 oz of beer, 5 oz of wine, or 1 oz of hard liquor.  Do not use any products that contain nicotine or tobacco, such as cigarettes and e-cigarettes. If you need help quitting, ask your health care provider. Preventing falls  Use devices to help you move around (mobility aids) as needed, such as canes, walkers, scooters, or crutches.  Keep rooms well-lit and clutter-free.  Remove tripping hazards from walkways, including cords and throw rugs.  Install grab bars in bathrooms and safety rails on stairs.  Use rubber mats in the bathroom and other areas that are often wet or slippery.  Wear closed-toe shoes that fit well and support your feet. Wear shoes that have rubber soles or low heels.  Review your medicines with your health care provider. Some medicines can cause dizziness or changes in blood pressure, which can increase your risk of falling. General instructions  Include calcium and vitamin D in your diet. Calcium is important for bone health, and vitamin D helps your body to absorb calcium. Good sources of calcium and vitamin D include: ? Certain fatty fish, such as salmon and tuna. ? Products that have calcium and vitamin D added to them (fortified products), such as fortified cereals. ? Egg yolks. ? Cheese. ? Liver.  Take over-the-counter and prescription medicines only as told by your health care provider.  Keep all follow-up visits as told by your health care provider. This is important. Contact a health care provider if:  You have never been screened for osteoporosis and you are: ? A woman who is age 53 or older. ? A man who is age 72 or older. Get help right away  if:  You fall or injure yourself. Summary  Osteoporosis is thinning and loss of density in your bones. This makes bones more brittle and fragile and more likely to break (fracture),even with minor falls.  The goal of treatment is to strengthen your bones and reduce your risk for a fracture.  Include calcium and vitamin D in your diet. Calcium is important for bone health, and vitamin D helps your body to absorb calcium.  Talk with your health care provider about screening for osteoporosis if you are a woman who is age 9 or older, or a man who is age 85 or older. This information is not intended to replace advice given to you by your health care provider. Make sure you discuss any questions you have with your health care provider. Document Revised: 06/04/2017 Document Reviewed: 04/16/2017 Elsevier  Patient Education  [employer].

## 2019-12-28 DIAGNOSIS — S46012D Strain of muscle(s) and tendon(s) of the rotator cuff of left shoulder, subsequent encounter: Secondary | ICD-10-CM | POA: Diagnosis not present

## 2019-12-28 DIAGNOSIS — S42202D Unspecified fracture of upper end of left humerus, subsequent encounter for fracture with routine healing: Secondary | ICD-10-CM | POA: Diagnosis not present

## 2019-12-28 DIAGNOSIS — M25612 Stiffness of left shoulder, not elsewhere classified: Secondary | ICD-10-CM | POA: Diagnosis not present

## 2019-12-28 DIAGNOSIS — M25622 Stiffness of left elbow, not elsewhere classified: Secondary | ICD-10-CM | POA: Diagnosis not present

## 2019-12-29 DIAGNOSIS — Z13220 Encounter for screening for lipoid disorders: Secondary | ICD-10-CM | POA: Diagnosis not present

## 2019-12-29 DIAGNOSIS — M8000XD Age-related osteoporosis with current pathological fracture, unspecified site, subsequent encounter for fracture with routine healing: Secondary | ICD-10-CM | POA: Diagnosis not present

## 2019-12-29 LAB — LIPID PANEL W/REFLEX DIRECT LDL
Cholesterol: 239 mg/dL — ABNORMAL HIGH (ref <200)
HDL: 64 mg/dL (ref >=50)
LDL Cholesterol (Calc): 155 mg/dL (calc) — ABNORMAL HIGH
Non-HDL Cholesterol (Calc): 175 mg/dL (calc) — ABNORMAL HIGH (ref <130)
Total CHOL/HDL Ratio: 3.7 (calc) (ref <5.0)
Triglycerides: 94 mg/dL (ref <150)

## 2019-12-30 LAB — COMPLETE METABOLIC PANEL WITH GFR
AG Ratio: 1.5 (calc) (ref 1.0–2.5)
ALT: 13 U/L (ref 6–29)
AST: 13 U/L (ref 10–35)
Albumin: 4.3 g/dL (ref 3.6–5.1)
Alkaline phosphatase (APISO): 113 U/L (ref 37–153)
BUN/Creatinine Ratio: 23 (calc) — ABNORMAL HIGH (ref 6–22)
BUN: 13 mg/dL (ref 7–25)
CO2: 30 mmol/L (ref 20–32)
Calcium: 9.9 mg/dL (ref 8.6–10.4)
Chloride: 101 mmol/L (ref 98–110)
Creat: 0.56 mg/dL — ABNORMAL LOW (ref 0.60–0.93)
GFR, Est African American: 108 mL/min/{1.73_m2} (ref >=60)
GFR, Est Non African American: 93 mL/min/{1.73_m2} (ref >=60)
Globulin: 2.8 g/dL (calc) (ref 1.9–3.7)
Glucose, Bld: 103 mg/dL — ABNORMAL HIGH (ref 65–99)
Potassium: 4.1 mmol/L (ref 3.5–5.3)
Sodium: 139 mmol/L (ref 135–146)
Total Bilirubin: 0.5 mg/dL (ref 0.2–1.2)
Total Protein: 7.1 g/dL (ref 6.1–8.1)

## 2019-12-30 LAB — VITAMIN D 25 HYDROXY (VIT D DEFICIENCY, FRACTURES): Vit D, 25-Hydroxy: 136 ng/mL — ABNORMAL HIGH (ref 30–100)

## 2020-01-01 ENCOUNTER — Telehealth: Payer: Self-pay | Admitting: Neurology

## 2020-01-01 ENCOUNTER — Other Ambulatory Visit: Payer: Self-pay | Admitting: Physician Assistant

## 2020-01-01 MED ORDER — ATORVASTATIN CALCIUM 10 MG PO TABS: 10.0000 mg | ORAL_TABLET | Freq: Every day | ORAL | 3 refills | Status: DC

## 2020-01-01 NOTE — Telephone Encounter (Signed)
Patient okay with Prolia injection. Please get authorization so we can schedule. Thanks!

## 2020-01-01 NOTE — Progress Notes (Signed)
Regina Walters,   Liver and kidney look great.  Your vitamin D is high which we rarely see. Confirm how much vitamin D she is getting. Whatever she is taking we need to reduce it just a bit to get in normal range.  Your HDL(good) cholesterol is great.  Your LDL(bad) cholesterol is elevated.  With your age and numbers your risk of CV event in 10 years is 12 percent. Above 7.5 percent we suggest treatment with a statin medication daily. Thoughts?

## 2020-01-02 NOTE — Telephone Encounter (Signed)
Left voicemail to make an appointment.

## 2020-01-02 NOTE — Telephone Encounter (Signed)
Please call and schedule for a nurse visit Prolia injection.   No PA required. Ref # [id number]

## 2020-01-02 NOTE — Telephone Encounter (Signed)
Scheduled

## 2020-01-05 ENCOUNTER — Ambulatory Visit (INDEPENDENT_AMBULATORY_CARE_PROVIDER_SITE_OTHER): Payer: PPO | Admitting: Physician Assistant

## 2020-01-05 VITALS — BP 130/80 | HR 76

## 2020-01-05 DIAGNOSIS — M8000XD Age-related osteoporosis with current pathological fracture, unspecified site, subsequent encounter for fracture with routine healing: Secondary | ICD-10-CM

## 2020-01-05 MED ORDER — DENOSUMAB 60 MG/ML ~~LOC~~ SOSY
60.0000 mg | PREFILLED_SYRINGE | Freq: Once | SUBCUTANEOUS | Status: AC
  Administered 2020-01-05: 60 mg via SUBCUTANEOUS

## 2020-01-05 NOTE — Progress Notes (Signed)
Patient is here for her first Prolia injection. Prolia injection to right arm with no apparent complications. Patient advised to schedule next injection in 6 months. Will need labs 1-2 weeks prior. Patient doing well on recent RX for Crestor. No problems. Will call if needed.

## 2020-01-05 NOTE — Progress Notes (Signed)
Patient ID: Regina Walters, female   DOB: [birth year], 74 y.o.   MRN: [id number] Agree with plan.

## 2020-05-02 ENCOUNTER — Other Ambulatory Visit: Payer: Self-pay | Admitting: Neurology

## 2020-05-02 DIAGNOSIS — E782 Mixed hyperlipidemia: Secondary | ICD-10-CM

## 2020-05-02 DIAGNOSIS — M8000XD Age-related osteoporosis with current pathological fracture, unspecified site, subsequent encounter for fracture with routine healing: Secondary | ICD-10-CM

## 2020-05-20 ENCOUNTER — Encounter: Payer: Self-pay | Admitting: Physician Assistant

## 2020-05-20 ENCOUNTER — Telehealth (INDEPENDENT_AMBULATORY_CARE_PROVIDER_SITE_OTHER): Payer: PPO | Admitting: Physician Assistant

## 2020-05-20 VITALS — BP 127/76 | Ht <= 58 in | Wt 125.0 lb

## 2020-05-20 DIAGNOSIS — F419 Anxiety disorder, unspecified: Secondary | ICD-10-CM

## 2020-05-20 DIAGNOSIS — F5101 Primary insomnia: Secondary | ICD-10-CM | POA: Insufficient documentation

## 2020-05-20 MED ORDER — MIRTAZAPINE 7.5 MG PO TABS: 7.5000 mg | ORAL_TABLET | Freq: Every day | ORAL | 1 refills | Status: DC

## 2020-05-20 NOTE — Progress Notes (Signed)
Has tried Melatonin, Simply Sleep OTC, and Aleve PM Still has some nights where she is up most the night Never been on prescription sleep aid

## 2020-05-20 NOTE — Progress Notes (Signed)
Patient ID: Regina Walters, female   DOB: 10/28/1945, 74 y.o.   MRN: [id number] .Marland KitchenVirtual Visit via Telephone Note  I connected with Regina Walters on 05/20/20 at  8:50 AM EST by telephone and verified that I am speaking with the correct person using two identifiers.  Location: Patient: home Provider: clinic   I discussed the limitations, risks, security and privacy concerns of performing an evaluation and management service by telephone and the availability of in person appointments. I also discussed with the patient that there may be a patient responsible charge related to this service. The patient expressed understanding and agreed to proceed.   History of Present Illness: Pt is a 74 yo female who calls into the clinic to discuss worsening insomnia. She has had problems with sleep for years but over last 6 months worsened. She was able to take melatonin and unisom and prn aleve PM and sleep. Now they do not help. She does watch TV in bed. No caffiene past 4pm. She does not take naps. She does not snore. Recently she did not go to sleep until 5am and slept 2 hours.  Never tried prescription.   .. Active Ambulatory Problems    Diagnosis Date Noted   Acute on chronic cholecystitis s/p lap cholecystectomy 04/21/2019 04/10/2019   Gastroesophageal reflux disease 04/10/2019   Liver cyst 04/09/2013   Displaced fracture of proximal end of left humerus 06/09/2019   Elevated blood pressure reading without diagnosis of hypertension 10/11/2019   Right carotid bruit 10/11/2019   Pulsatile neck mass 10/13/2019   Systolic murmur 10/13/2019   Mixed hyperlipidemia 10/13/2019   Anxiety 05/20/2020   Primary insomnia 05/20/2020   Resolved Ambulatory Problems    Diagnosis Date Noted   No Resolved Ambulatory Problems   Past Medical History:  Diagnosis Date   History of kidney stones    Kidney stone on left side 2014   Reviewed med, allergy, problem list.      Observations/Objective: Normal breathing Normal mood.   .. Today's Vitals   05/20/20 0821  BP: 127/76  Weight: 125 lb (56.7 kg)  Height: 4\' 10"  (1.473 m)   Body mass index is 26.13 kg/m.    Assessment and Plan: Marland KitchenRuchy was seen today for insomnia.  Diagnoses and all orders for this visit:  Primary insomnia -     mirtazapine (REMERON) 7.5 MG tablet; Take 1 tablet (7.5 mg total) by mouth at bedtime.  Anxiety   Discussed sleep hygiene. Encouraged patient not to watch TV in bed. Set a routine. Get a sound machine. Listen to a app to help calm down at night. Encouraged reading. Start remeron. Could help with sleep and a little with anxiety and mood that patients granddaughter thinks is more of an issue than patient thinks. Follow up in 1 month.    Follow Up Instructions:    I discussed the assessment and treatment plan with the patient. The patient was provided an opportunity to ask questions and all were answered. The patient agreed with the plan and demonstrated an understanding of the instructions.   The patient was advised to call back or seek an in-person evaluation if the symptoms worsen or if the condition fails to improve as anticipated.  I provided 15 minutes of non-face-to-face time during this encounter.   Gavin Pound, PA-C

## 2020-05-20 NOTE — Patient Instructions (Signed)

## 2020-06-05 ENCOUNTER — Telehealth: Payer: Self-pay | Admitting: Neurology

## 2020-06-05 DIAGNOSIS — F5101 Primary insomnia: Secondary | ICD-10-CM

## 2020-06-05 MED ORDER — MIRTAZAPINE 7.5 MG PO TABS: 7.5000 mg | ORAL_TABLET | Freq: Every day | ORAL | 0 refills | Status: DC

## 2020-06-05 NOTE — Telephone Encounter (Signed)
Patient doing well on Remeron. Sleeping well. Refill sent to pharmacy.

## 2020-06-10 MED ORDER — MIRTAZAPINE 15 MG PO TABS: 15.0000 mg | ORAL_TABLET | Freq: Every day | ORAL | 0 refills | Status: DC

## 2020-06-10 NOTE — Addendum Note (Signed)
Addended bySilvio Pate on: 06/10/2020 09:08 AM   Modules accepted: Orders

## 2020-06-10 NOTE — Telephone Encounter (Signed)
Patient having occasional restless nights. Per Lesly Rubenstein okay to increase to 15 mg. Sent to pharmacy. Patient aware.

## 2020-06-21 ENCOUNTER — Telehealth: Payer: Self-pay | Admitting: Physician Assistant

## 2020-06-26 NOTE — Telephone Encounter (Signed)
error 

## 2020-07-01 DIAGNOSIS — M8000XD Age-related osteoporosis with current pathological fracture, unspecified site, subsequent encounter for fracture with routine healing: Secondary | ICD-10-CM | POA: Diagnosis not present

## 2020-07-01 DIAGNOSIS — E782 Mixed hyperlipidemia: Secondary | ICD-10-CM | POA: Diagnosis not present

## 2020-07-01 LAB — COMPLETE METABOLIC PANEL WITH GFR
AG Ratio: 1.6 (calc) (ref 1.0–2.5)
ALT: 12 U/L (ref 6–29)
AST: 14 U/L (ref 10–35)
Albumin: 4.2 g/dL (ref 3.6–5.1)
Alkaline phosphatase (APISO): 70 U/L (ref 37–153)
BUN: 12 mg/dL (ref 7–25)
CO2: 31 mmol/L (ref 20–32)
Calcium: 9.4 mg/dL (ref 8.6–10.4)
Chloride: 105 mmol/L (ref 98–110)
Creat: 0.62 mg/dL (ref 0.60–0.93)
GFR, Est African American: 104 mL/min/{1.73_m2} (ref >=60)
GFR, Est Non African American: 90 mL/min/{1.73_m2} (ref >=60)
Globulin: 2.6 g/dL (calc) (ref 1.9–3.7)
Glucose, Bld: 105 mg/dL — ABNORMAL HIGH (ref 65–99)
Potassium: 4.5 mmol/L (ref 3.5–5.3)
Sodium: 142 mmol/L (ref 135–146)
Total Bilirubin: 0.5 mg/dL (ref 0.2–1.2)
Total Protein: 6.8 g/dL (ref 6.1–8.1)

## 2020-07-01 LAB — LIPID PANEL W/REFLEX DIRECT LDL
Cholesterol: 181 mg/dL (ref <200)
HDL: 64 mg/dL (ref >=50)
LDL Cholesterol (Calc): 98 mg/dL (calc)
Non-HDL Cholesterol (Calc): 117 mg/dL (calc) (ref <130)
Total CHOL/HDL Ratio: 2.8 (calc) (ref <5.0)
Triglycerides: 91 mg/dL (ref <150)

## 2020-07-02 NOTE — Progress Notes (Signed)
Regina Walters,   Cholesterol is MUCH better! Continue on lipitor do you need any refills?  Kidney function in normal range.  Liver looks great.  Fasting sugar just a little elevated next time in office will do an A1C. Do you have a scheduled appt?

## 2020-07-11 ENCOUNTER — Other Ambulatory Visit: Payer: Self-pay

## 2020-07-11 ENCOUNTER — Ambulatory Visit (INDEPENDENT_AMBULATORY_CARE_PROVIDER_SITE_OTHER): Payer: PPO | Admitting: Family Medicine

## 2020-07-11 DIAGNOSIS — M810 Age-related osteoporosis without current pathological fracture: Secondary | ICD-10-CM | POA: Diagnosis not present

## 2020-07-11 MED ORDER — DENOSUMAB 60 MG/ML ~~LOC~~ SOSY
60.0000 mg | PREFILLED_SYRINGE | Freq: Once | SUBCUTANEOUS | Status: AC
  Administered 2020-07-11: 60 mg via SUBCUTANEOUS

## 2020-07-11 NOTE — Progress Notes (Signed)
Prolia injection given today for osteoporosis  Nani Gasser, MD

## 2020-07-22 ENCOUNTER — Other Ambulatory Visit: Payer: Self-pay | Admitting: Physician Assistant

## 2020-07-22 ENCOUNTER — Telehealth (INDEPENDENT_AMBULATORY_CARE_PROVIDER_SITE_OTHER): Payer: PPO | Admitting: Physician Assistant

## 2020-07-22 ENCOUNTER — Encounter: Payer: Self-pay | Admitting: Physician Assistant

## 2020-07-22 VITALS — BP 127/73 | HR 78 | Ht <= 58 in | Wt 125.0 lb

## 2020-07-22 DIAGNOSIS — F5101 Primary insomnia: Secondary | ICD-10-CM

## 2020-07-22 MED ORDER — MIRTAZAPINE 30 MG PO TABS: 30.0000 mg | ORAL_TABLET | Freq: Every day | ORAL | 2 refills | Status: DC

## 2020-07-22 NOTE — Progress Notes (Signed)
Patient ID: [name], female   DOB: 10/29/1945, 75 y.o.   MRN: [id number] .Marland KitchenVirtual Visit via Telephone Note  I connected with Regina Walters on 07/22/2020 at  2:20 PM EST by telephone and verified that I am speaking with the correct person using two identifiers.  Location: Patient: home Provider: clinic  .Marland KitchenParticipating in visit:  Patient: Regina Walters Provider:Cosmo Tetreault Caleen Essex PA-C   I discussed the limitations, risks, security and privacy concerns of performing an evaluation and management service by telephone and the availability of in person appointments. I also discussed with the patient that there may be a patient responsible charge related to this service. The patient expressed understanding and agreed to proceed.   History of Present Illness: Patient is a 75 year old female with primary insomnia who calls into the clinic to discuss sleeping.  She was started on Remeron about 6 months ago.  It initially worked very well.  Few months later she started noticing more nighttime awakenings therefore we increase it to 15 mg.  It is worked well for a few months but now she is noticing nighttime awakenings again.  She tries to get to bed about 10:00.  She is waking up about 130 2:00 and laying in bed for hours tossing and turning.  She does watch TV before bed but cuts it off before she goes to sleep.  She denies any snoring or getting up to urinate as cause for awakening.  She does tend to go back to sleep about 530 or 6 and sleep for another 1 to 2 hours and then gets up.  She does not nap.  .. Active Ambulatory Problems    Diagnosis Date Noted  . Acute on chronic cholecystitis s/p lap cholecystectomy 04/21/2019 04/10/2019  . Gastroesophageal reflux disease 04/10/2019  . Liver cyst 04/09/2013  . Displaced fracture of proximal end of left humerus 06/09/2019  . Elevated blood pressure reading without diagnosis of hypertension 10/11/2019  . Right carotid bruit 10/11/2019   . Pulsatile neck mass 10/13/2019  . Systolic murmur 10/13/2019  . Mixed hyperlipidemia 10/13/2019  . Anxiety 05/20/2020  . Primary insomnia 05/20/2020   Resolved Ambulatory Problems    Diagnosis Date Noted  . No Resolved Ambulatory Problems   Past Medical History:  Diagnosis Date  . History of kidney stones   . Kidney stone on left side 2014   Reviewed med, allergy, problem list.      Observations/Objective: No acute distress.  Normal mood.   .. Today's Vitals   07/22/20 0929  BP: 127/73  Pulse: 78  Weight: 125 lb (56.7 kg)  Height: 4\' 10"  (1.473 m)   Body mass index is 26.13 kg/m.    Assessment and Plan: Marland KitchenDiagnoses and all orders for this visit:  Primary insomnia -     mirtazapine (REMERON) 30 MG tablet; Take 1 tablet (30 mg total) by mouth at bedtime.    Patient has responded really well to Remeron for sleep.  She is hesitant to try anything else.  We did go ahead and increase it to 30 mg.  Discussed sleep hygiene.  Discussed possible sleep referral.  She wants to hold on that now.  I do think when she does wake up in the middle the night if she does not go back to sleep within 15 to 20 minutes she needs to get up and try to read or do some type of task in a low lit area until she is sleepy again.  Also discussed no napping  during the day which she does not look like she does with the importance of getting ready for bed.  Follow-up if no benefit of the 30 mg Remeron.  Follow Up Instructions:    I discussed the assessment and treatment plan with the patient. The patient was provided an opportunity to ask questions and all were answered. The patient agreed with the plan and demonstrated an understanding of the instructions.   The patient was advised to call back or seek an in-person evaluation if the symptoms worsen or if the condition fails to improve as anticipated.  I provided 10 minutes of non-face-to-face time during this encounter.   Tandy Gaw,  PA-C

## 2020-07-22 NOTE — Progress Notes (Unsigned)
Was doing well on Remeron, now having difficulties the past week with sleeping Can fall asleep at night but wakes up either in the middle of the night for a few hours or only sleeps for 4 hours and is up

## 2020-07-22 NOTE — Patient Instructions (Signed)

## 2020-07-23 ENCOUNTER — Encounter: Payer: Self-pay | Admitting: Physician Assistant

## 2020-10-21 ENCOUNTER — Telehealth: Payer: Self-pay | Admitting: Neurology

## 2020-10-21 ENCOUNTER — Other Ambulatory Visit: Payer: Self-pay | Admitting: Neurology

## 2020-10-21 DIAGNOSIS — M810 Age-related osteoporosis without current pathological fracture: Secondary | ICD-10-CM

## 2020-10-21 DIAGNOSIS — Z79899 Other long term (current) drug therapy: Secondary | ICD-10-CM

## 2020-10-21 DIAGNOSIS — R7301 Impaired fasting glucose: Secondary | ICD-10-CM

## 2020-10-21 MED ORDER — ATORVASTATIN CALCIUM 10 MG PO TABS: 10.0000 mg | ORAL_TABLET | Freq: Every day | ORAL | 1 refills | Status: DC

## 2020-10-21 NOTE — Telephone Encounter (Signed)
Patient needs labs ordered before Prolia injection in June. Labs ordered. Cholesterol medication refill sent.

## 2020-10-25 ENCOUNTER — Other Ambulatory Visit: Payer: Self-pay | Admitting: Physician Assistant

## 2020-10-25 MED ORDER — NITROFURANTOIN MONOHYD MACRO 100 MG PO CAPS: 100.0000 mg | ORAL_CAPSULE | Freq: Two times a day (BID) | ORAL | 0 refills | Status: DC

## 2020-10-25 NOTE — Progress Notes (Signed)
Out of town. UTI symptoms. Taking azo for 2 days with no relief. Sent macrobid.

## 2020-12-04 ENCOUNTER — Other Ambulatory Visit: Payer: Self-pay | Admitting: Physician Assistant

## 2020-12-09 ENCOUNTER — Ambulatory Visit: Payer: PPO | Admitting: Physician Assistant

## 2020-12-31 DIAGNOSIS — M810 Age-related osteoporosis without current pathological fracture: Secondary | ICD-10-CM | POA: Diagnosis not present

## 2020-12-31 DIAGNOSIS — Z79899 Other long term (current) drug therapy: Secondary | ICD-10-CM | POA: Diagnosis not present

## 2020-12-31 DIAGNOSIS — R7301 Impaired fasting glucose: Secondary | ICD-10-CM | POA: Diagnosis not present

## 2021-01-01 ENCOUNTER — Ambulatory Visit: Payer: PPO | Admitting: Physician Assistant

## 2021-01-01 LAB — HEMOGLOBIN A1C
Hgb A1c MFr Bld: 5.1 % of total Hgb (ref <5.7)
Mean Plasma Glucose: 100 mg/dL
eAG (mmol/L): 5.5 mmol/L

## 2021-01-01 LAB — COMPLETE METABOLIC PANEL WITH GFR
AG Ratio: 1.7 (calc) (ref 1.0–2.5)
ALT: 15 U/L (ref 6–29)
AST: 17 U/L (ref 10–35)
Albumin: 4.3 g/dL (ref 3.6–5.1)
Alkaline phosphatase (APISO): 64 U/L (ref 37–153)
BUN/Creatinine Ratio: 18 (calc) (ref 6–22)
BUN: 10 mg/dL (ref 7–25)
CO2: 29 mmol/L (ref 20–32)
Calcium: 10 mg/dL (ref 8.6–10.4)
Chloride: 104 mmol/L (ref 98–110)
Creat: 0.56 mg/dL — ABNORMAL LOW (ref 0.60–0.93)
GFR, Est African American: 107 mL/min/{1.73_m2} (ref >=60)
GFR, Est Non African American: 93 mL/min/{1.73_m2} (ref >=60)
Globulin: 2.6 g/dL (calc) (ref 1.9–3.7)
Glucose, Bld: 126 mg/dL (ref 65–139)
Potassium: 4 mmol/L (ref 3.5–5.3)
Sodium: 141 mmol/L (ref 135–146)
Total Bilirubin: 0.7 mg/dL (ref 0.2–1.2)
Total Protein: 6.9 g/dL (ref 6.1–8.1)

## 2021-01-01 NOTE — Progress Notes (Signed)
Debbie,   A1C looks great at 5.1. no signs of diabetes.  Electrolytes look great.  Kidney and liver functions look amazing.

## 2021-01-03 ENCOUNTER — Ambulatory Visit (INDEPENDENT_AMBULATORY_CARE_PROVIDER_SITE_OTHER): Payer: PPO | Admitting: Physician Assistant

## 2021-01-03 ENCOUNTER — Other Ambulatory Visit: Payer: Self-pay

## 2021-01-03 VITALS — BP 176/68 | HR 55 | Wt 135.0 lb

## 2021-01-03 DIAGNOSIS — M810 Age-related osteoporosis without current pathological fracture: Secondary | ICD-10-CM | POA: Diagnosis not present

## 2021-01-03 DIAGNOSIS — F5101 Primary insomnia: Secondary | ICD-10-CM

## 2021-01-03 DIAGNOSIS — R030 Elevated blood-pressure reading, without diagnosis of hypertension: Secondary | ICD-10-CM

## 2021-01-03 MED ORDER — DENOSUMAB 60 MG/ML ~~LOC~~ SOSY
60.0000 mg | PREFILLED_SYRINGE | Freq: Once | SUBCUTANEOUS | Status: AC
  Administered 2021-01-03: 60 mg via SUBCUTANEOUS

## 2021-01-03 NOTE — Patient Instructions (Signed)
https://www.nhlbi.nih.gov/files/docs/public/heart/dash_brief.pdf">  DASH Eating Plan DASH stands for Dietary Approaches to Stop Hypertension. The DASH eating plan is a healthy eating plan that has been shown to: Reduce high blood pressure (hypertension). Reduce your risk for type 2 diabetes, heart disease, and stroke. Help with weight loss. What are tips for following this plan? Reading food labels Check food labels for the amount of salt (sodium) per serving. Choose foods with less than 5 percent of the Daily Value of sodium. Generally, foods with less than 300 milligrams (mg) of sodium per serving fit into this eating plan. To find whole grains, look for the word "whole" as the first word in the ingredient list. Shopping Buy products labeled as "low-sodium" or "no salt added." Buy fresh foods. Avoid canned foods and pre-made or frozen meals. Cooking Avoid adding salt when cooking. Use salt-free seasonings or herbs instead of table salt or sea salt. Check with your health care provider or pharmacist before using salt substitutes. Do not fry foods. Cook foods using healthy methods such as baking, boiling, grilling, roasting, and broiling instead. Cook with heart-healthy oils, such as olive, canola, avocado, soybean, or sunflower oil. Meal planning  Eat a balanced diet that includes: 4 or more servings of fruits and 4 or more servings of vegetables each day. Try to fill one-half of your plate with fruits and vegetables. 6-8 servings of whole grains each day. Less than 6 oz (170 g) of lean meat, poultry, or fish each day. A 3-oz (85-g) serving of meat is about the same size as a deck of cards. One egg equals 1 oz (28 g). 2-3 servings of low-fat dairy each day. One serving is 1 cup (237 mL). 1 serving of nuts, seeds, or beans 5 times each week. 2-3 servings of heart-healthy fats. Healthy fats called omega-3 fatty acids are found in foods such as walnuts, flaxseeds, fortified milks, and eggs.  These fats are also found in cold-water fish, such as sardines, salmon, and mackerel. Limit how much you eat of: Canned or prepackaged foods. Food that is high in trans fat, such as some fried foods. Food that is high in saturated fat, such as fatty meat. Desserts and other sweets, sugary drinks, and other foods with added sugar. Full-fat dairy products. Do not salt foods before eating. Do not eat more than 4 egg yolks a week. Try to eat at least 2 vegetarian meals a week. Eat more home-cooked food and less restaurant, buffet, and fast food.  Lifestyle When eating at a restaurant, ask that your food be prepared with less salt or no salt, if possible. If you drink alcohol: Limit how much you use to: 0-1 drink a day for women who are not pregnant. 0-2 drinks a day for men. Be aware of how much alcohol is in your drink. In the [place] one drink equals one 12 oz bottle of beer (355 mL), one 5 oz glass of wine (148 mL), or one 1 oz glass of hard liquor (44 mL). General information Avoid eating more than 2,300 mg of salt a day. If you have hypertension, you may need to reduce your sodium intake to 1,500 mg a day. Work with your health care provider to maintain a healthy body weight or to lose weight. Ask what an ideal weight is for you. Get at least 30 minutes of exercise that causes your heart to beat faster (aerobic exercise) most days of the week. Activities may include walking, swimming, or biking. Work with your health care provider   or dietitian to adjust your eating plan to your individual calorie needs. What foods should I eat? Fruits All fresh, dried, or frozen fruit. Canned fruit in natural juice (without addedsugar). Vegetables Fresh or frozen vegetables (raw, steamed, roasted, or grilled). Low-sodium or reduced-sodium tomato and vegetable juice. Low-sodium or reduced-sodium tomatosauce and tomato paste. Low-sodium or reduced-sodium canned vegetables. Grains Whole-grain or  whole-wheat bread. Whole-grain or whole-wheat pasta. Brown rice. Oatmeal. Quinoa. Bulgur. Whole-grain and low-sodium cereals. Pita bread.Low-fat, low-sodium crackers. Whole-wheat flour tortillas. Meats and other proteins Skinless chicken or turkey. Ground chicken or turkey. Pork with fat trimmed off. Fish and seafood. Egg whites. Dried beans, peas, or lentils. Unsalted nuts, nut butters, and seeds. Unsalted canned beans. Lean cuts of beef with fat trimmed off. Low-sodium, lean precooked or cured meat, such as sausages or meatloaves. Dairy Low-fat (1%) or fat-free (skim) milk. Reduced-fat, low-fat, or fat-free cheeses. Nonfat, low-sodium ricotta or cottage cheese. Low-fat or nonfatyogurt. Low-fat, low-sodium cheese. Fats and oils Soft margarine without trans fats. Vegetable oil. Reduced-fat, low-fat, or light mayonnaise and salad dressings (reduced-sodium). Canola, safflower, olive, avocado, soybean, andsunflower oils. Avocado. Seasonings and condiments Herbs. Spices. Seasoning mixes without salt. Other foods Unsalted popcorn and pretzels. Fat-free sweets. The items listed above may not be a complete list of foods and beverages you can eat. Contact a dietitian for more information. What foods should I avoid? Fruits Canned fruit in a light or heavy syrup. Fried fruit. Fruit in cream or buttersauce. Vegetables Creamed or fried vegetables. Vegetables in a cheese sauce. Regular canned vegetables (not low-sodium or reduced-sodium). Regular canned tomato sauce and paste (not low-sodium or reduced-sodium). Regular tomato and vegetable juice(not low-sodium or reduced-sodium). Pickles. Olives. Grains Baked goods made with fat, such as croissants, muffins, or some breads. Drypasta or rice meal packs. Meats and other proteins Fatty cuts of meat. Ribs. Fried meat. Bacon. Bologna, salami, and other precooked or cured meats, such as sausages or meat loaves. Fat from the back of a pig (fatback). Bratwurst.  Salted nuts and seeds. Canned beans with added salt. Canned orsmoked fish. Whole eggs or egg yolks. Chicken or turkey with skin. Dairy Whole or 2% milk, cream, and half-and-half. Whole or full-fat cream cheese. Whole-fat or sweetened yogurt. Full-fat cheese. Nondairy creamers. Whippedtoppings. Processed cheese and cheese spreads. Fats and oils Butter. Stick margarine. Lard. Shortening. Ghee. Bacon fat. Tropical oils, suchas coconut, palm kernel, or palm oil. Seasonings and condiments Onion salt, garlic salt, seasoned salt, table salt, and sea salt. Worcestershire sauce. Tartar sauce. Barbecue sauce. Teriyaki sauce. Soy sauce, including reduced-sodium. Steak sauce. Canned and packaged gravies. Fish sauce. Oyster sauce. Cocktail sauce. Store-bought horseradish. Ketchup. Mustard. Meat flavorings and tenderizers. Bouillon cubes. Hot sauces. Pre-made or packaged marinades. Pre-made or packaged taco seasonings. Relishes. Regular saladdressings. Other foods Salted popcorn and pretzels. The items listed above may not be a complete list of foods and beverages you should avoid. Contact a dietitian for more information. Where to find more information National Heart, Lung, and Blood Institute: www.nhlbi.nih.gov American Heart Association: www.heart.org Academy of Nutrition and Dietetics: www.eatright.org National Kidney Foundation: www.kidney.org Summary The DASH eating plan is a healthy eating plan that has been shown to reduce high blood pressure (hypertension). It may also reduce your risk for type 2 diabetes, heart disease, and stroke. When on the DASH eating plan, aim to eat more fresh fruits and vegetables, whole grains, lean proteins, low-fat dairy, and heart-healthy fats. With the DASH eating plan, you should limit salt (sodium) intake to 2,300   mg a day. If you have hypertension, you may need to reduce your sodium intake to 1,500 mg a day. Work with your health care provider or dietitian to adjust  your eating plan to your individual calorie needs. This information is not intended to replace advice given to you by your health care provider. Make sure you discuss any questions you have with your healthcare provider. Document Revised: 05/26/2019 Document Reviewed: 05/26/2019 Elsevier Patient Education  2022 Elsevier Inc.  

## 2021-01-07 ENCOUNTER — Encounter: Payer: Self-pay | Admitting: Physician Assistant

## 2021-01-07 NOTE — Progress Notes (Signed)
Subjective:    Patient ID: Regina Walters, female    DOB: 02/10/1946, 75 y.o.   MRN: [id number]  HPI Patient is a 75 year old female with osteoporosis, insomnia, hyperlipidemia, GERD who presents to the clinic for follow-up and medication refills.  She did get her Prolia shot today.  No complications.  She continues on vitamin D and calcium.  She continues to consider active.  Patient continues to struggle with sleep.  She has found that hemp oil works the best.  She is not on any medications for sleep except him.  Patient has been noticing her blood pressures rising some at home.  She is getting more more in the 130s and 140s over 70s and 80s.  She denies any chest pain, palpitations, headache or vision changes.no extremity edema or SOB. She admits to eating a lot of salt. She has hx of BP being higher at office reading than at home.   .. Active Ambulatory Problems    Diagnosis Date Noted   Acute on chronic cholecystitis s/p lap cholecystectomy 04/21/2019 04/10/2019   Gastroesophageal reflux disease 04/10/2019   Liver cyst 04/09/2013   Displaced fracture of proximal end of left humerus 06/09/2019   Elevated blood pressure reading without diagnosis of hypertension 10/11/2019   Right carotid bruit 10/11/2019   Pulsatile neck mass 10/13/2019   Systolic murmur 10/13/2019   Mixed hyperlipidemia 10/13/2019   Anxiety 05/20/2020   Primary insomnia 05/20/2020   Osteoporosis 01/03/2021   Resolved Ambulatory Problems    Diagnosis Date Noted   No Resolved Ambulatory Problems   Past Medical History:  Diagnosis Date   History of kidney stones    Kidney stone on left side 2014       Review of Systems  All other systems reviewed and are negative.     Objective:   Physical Exam Vitals reviewed.  Constitutional:      Appearance: Normal appearance. She is obese.  HENT:     Head: Normocephalic.     Right Ear: Tympanic membrane normal.     Left Ear: Tympanic membrane  normal.  Eyes:     Conjunctiva/sclera: Conjunctivae normal.  Neck:     Vascular: Carotid bruit present.     Comments: Right known carotid bruit. Cardiovascular:     Rate and Rhythm: Normal rate and regular rhythm.     Pulses: Normal pulses.     Heart sounds: No murmur heard. Pulmonary:     Effort: Pulmonary effort is normal.     Breath sounds: Normal breath sounds.  Musculoskeletal:     Cervical back: Normal range of motion and neck supple.     Right lower leg: No edema.     Left lower leg: No edema.  Lymphadenopathy:     Cervical: No cervical adenopathy.  Neurological:     General: No focal deficit present.     Mental Status: She is alert.  Psychiatric:        Mood and Affect: Mood normal.          Assessment & Plan:  Marland KitchenMarland KitchenDiagnoses and all orders for this visit:  Primary insomnia  Osteoporosis, unspecified osteoporosis type, unspecified pathological fracture presence -     denosumab (PROLIA) injection 60 mg  Elevated blood pressure reading without diagnosis of hypertension  Off all medication for sleep. Hemp oil is doing the best for this. Discussed good sleep hygiene. Follow up as needed.   Prolia given today. Follow up in 6 months. Continue vitamin D and calcium.  Bone density UTD.   Elevated BP in office. Reports she has hx of this. Home readings are 130 to 140s over 70 to 80s. Goal BP under 150/90 for her age and risk factors.  Discussed DASH diet.   Follow up in 1-2 months for BP and 6 months for next prolia.

## 2021-02-13 ENCOUNTER — Telehealth (INDEPENDENT_AMBULATORY_CARE_PROVIDER_SITE_OTHER): Payer: PPO | Admitting: Family Medicine

## 2021-02-13 ENCOUNTER — Encounter: Payer: Self-pay | Admitting: Family Medicine

## 2021-02-13 VITALS — BP 127/71 | Temp 97.3°F | Ht <= 58 in | Wt 135.0 lb

## 2021-02-13 DIAGNOSIS — J019 Acute sinusitis, unspecified: Secondary | ICD-10-CM

## 2021-02-13 MED ORDER — AMOXICILLIN-POT CLAVULANATE 875-125 MG PO TABS: 1.0000 | ORAL_TABLET | Freq: Two times a day (BID) | ORAL | 0 refills | Status: AC

## 2021-02-13 NOTE — Progress Notes (Signed)
Virtual Visit via Telephone Note  I connected with  Karene Fry on 02/13/21 at 10:10 AM EDT by telephone and verified that I am speaking with the correct person using two identifiers.   I discussed the limitations, risks, security and privacy concerns of performing an evaluation and management service by telephone and the availability of in person appointments. I also discussed with the patient that there may be a patient responsible charge related to this service. The patient expressed understanding and agreed to proceed.  Participating parties included in this telephone visit include: The patient and the nurse practitioner listed.  The patient is: At home I am: In the office  Subjective:    CC: sinus/ear pain  HPI: Regina Walters is a 75 y.o. year old female presenting today via telephone visit to discuss ear/sinus pain.  Patient states she has had about a week of worsening sinus/ear pressure. States her right hear is worse than left with 6/10 discomfort. No specific triggers for worsening pain. She also reports some rhinorrhea, nasal congestion, mild sinus pressure, mild coughing. States she had a cold that lasted for about 3 weeks which seemed to improve, but then the ear/sinus discomfort started. She has had 2 negative COVID tests. States she has been using Flonase and OTC drops for swimmers ear with no relief. Hearing has been slightly muffled, but she denies any fevers, chest pain, dyspnea, n/v/d, headaches, dizziness, fatigue/malaise.     Past medical history, Surgical history, Family history not pertinant except as noted below, Social history, Allergies, and medications have been entered into the medical record, reviewed, and corrections made.   Review of Systems:  All review of systems negative except what is listed in the HPI  Objective:    General:  Patient speaking clearly in complete sentences. No shortness of breath noted.   Alert and oriented x3.    Normal judgment.  No apparent acute distress.  Impression and Recommendations:    1. Acute non-recurrent sinusitis, unspecified location Difficult to fully assess on telephone call. Discussed with patient about possible sinus infection vs effusion d/t recent cold that would take some time to resolve on it's own. Shared decision making with patient. We agreed to  let her try a few more days of Flonase, warm compresses, OTC cough/cold/analgesics while going ahead and sending in Augmentin for her to have if needed since we are going into the weekend soon. She is agreeable to plan. Patient aware of signs/symptoms requiring further/urgent evaluation.  - amoxicillin-clavulanate (AUGMENTIN) 875-125 MG tablet; Take 1 tablet by mouth 2 (two) times daily for 7 days.  Dispense: 14 tablet; Refill: 0     I discussed the assessment and treatment plan with the patient. The patient was provided an opportunity to ask questions and all were answered. The patient agreed with the plan and demonstrated an understanding of the instructions.   The patient was advised to call back or seek an in-person evaluation if the symptoms worsen or if the condition fails to improve as anticipated.  I provided 20 minutes of non-face-to-face time during this TELEPHONE encounter.    Lollie Marrow Reola Calkins, DNP, FNP-C

## 2021-02-13 NOTE — Progress Notes (Signed)
Sinus issues, bilateral ear pain x 1 week Has tried ear drops - no help Covid test x 2 - negative

## 2021-02-17 ENCOUNTER — Ambulatory Visit (INDEPENDENT_AMBULATORY_CARE_PROVIDER_SITE_OTHER): Payer: PPO | Admitting: Osteopathic Medicine

## 2021-02-17 DIAGNOSIS — H6983 Other specified disorders of Eustachian tube, bilateral: Secondary | ICD-10-CM

## 2021-02-17 MED ORDER — PREDNISONE 20 MG PO TABS: 20.0000 mg | ORAL_TABLET | Freq: Two times a day (BID) | ORAL | 0 refills | Status: DC

## 2021-02-17 MED ORDER — IPRATROPIUM BROMIDE 0.06 % NA SOLN: 2.0000 | Freq: Four times a day (QID) | NASAL | 1 refills | Status: DC

## 2021-02-18 NOTE — Progress Notes (Signed)
Regina Walters is a 74 y.o. female who presents to  [facility] at [facility]  today, 02/17/21, seeking care for the following:  Concern for ER fullness, recently treated with antibiotics but it does not seem to have made much difference. On exam, clear effusion behind tympanic membrane, canals normal     ASSESSMENT & PLAN with other pertinent findings:  The encounter diagnosis was Dysfunction of both eustachian tubes.    No evidence for otitis media, more concern for eustachian tube dysfunction, will trial steroids and nasal spray as below and would refer to ENT if no improvement with this.  There are no Patient Instructions on file for this visit.  No orders of the defined types were placed in this encounter.   Meds ordered this encounter  Medications   predniSONE (DELTASONE) 20 MG tablet    Sig: Take 1 tablet (20 mg total) by mouth 2 (two) times daily with a meal.    Dispense:  10 tablet    Refill:  0   ipratropium (ATROVENT) 0.06 % nasal spray    Sig: Place 2 sprays into both nostrils 4 (four) times daily. As needed for runny nose / postnasal drip    Dispense:  15 mL    Refill:  1     See below for relevant physical exam findings  See below for recent lab and imaging results reviewed  Medications, allergies, PMH, PSH, SocH, FamH reviewed below    Follow-up instructions: Return if symptoms worsen or fail to improve.                                        Exam:  There were no vitals taken for this visit. Constitutional: VS see above. General Appearance: alert, well-developed, well-nourished, NAD Neck: No masses, trachea midline.  Respiratory: Normal respiratory effort.  Musculoskeletal: Gait normal.  Neurological: Normal balance/coordination. No tremor. Skin: warm, dry, intact.  Psychiatric: Normal judgment/insight. Normal mood and affect. Oriented x3.   Current Meds   Medication Sig   amoxicillin-clavulanate (AUGMENTIN) 875-125 MG tablet Take 1 tablet by mouth 2 (two) times daily for 7 days.   Apoaequorin (PREVAGEN PO) Take 1 tablet by mouth daily. CHEWABLES   atorvastatin (LIPITOR) 10 MG tablet Take 1 tablet (10 mg total) by mouth daily.   calcium-vitamin D 250-100 MG-UNIT tablet Take 1 tablet by mouth daily.   cetirizine (ZYRTEC) 10 MG tablet Take 10 mg by mouth every morning.   ELDERBERRY PO Take 2 tablets by mouth daily.   esomeprazole (NEXIUM) 40 MG capsule Take 40 mg by mouth daily before breakfast.   ibuprofen (ADVIL,MOTRIN) 200 MG tablet Take 600 mg by mouth every 6 (six) hours as needed. Pain   ipratropium (ATROVENT) 0.06 % nasal spray Place 2 sprays into both nostrils 4 (four) times daily. As needed for runny nose / postnasal drip   MAGNESIUM CITRATE PO Take 900 mg by mouth daily.   Methylcobalamin (B-12) 5000 MCG TBDP Take 5,000 mcg by mouth daily.   Multiple Minerals-Vitamins (CALCIUM CITRATE-MAG-MINERALS PO) Take by mouth.   predniSONE (DELTASONE) 20 MG tablet Take 1 tablet (20 mg total) by mouth 2 (two) times daily with a meal.    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/2019   Systolic murmur 10/13/2019   Mixed hyperlipidemia 10/13/2019   Elevated blood pressure reading without diagnosis of hypertension 10/11/2019   Right carotid bruit 10/11/2019   Displaced fracture of proximal end of left humerus 06/09/2019   Acute on chronic cholecystitis s/p lap cholecystectomy 04/21/2019 04/10/2019   Gastroesophageal reflux disease 04/10/2019   Liver cyst 04/09/2013    Family History  Problem Relation Age of Onset   AAA (abdominal aortic aneurysm) Mother    COPD Mother    Lymphoma Father    Hypertension Other    Asthma Other     Social History   Tobacco Use  Smoking Status Never  Smokeless  Tobacco Never    Past Surgical History:  Procedure Laterality Date   LAPAROSCOPIC CHOLECYSTECTOMY SINGLE SITE WITH INTRAOPERATIVE CHOLANGIOGRAM N/A 04/21/2019   Procedure: LAPAROSCOPIC CHOLECYSTECTOMY SINGLE SITE WITH INTRAOPERATIVE CHOLANGIOGRAM;  Surgeon: Karie Soda, MD;  Location: [facility];  Service: General;  Laterality: N/A;   ORIF HUMERUS FRACTURE Left 06/09/2019   Procedure: [facility] NAILING OF LEFT PROXIMAL HUMERUS;  Surgeon: Jodi Geralds, MD;  Location: [facility];  Service: Orthopedics;  Laterality: Left;   TONSILLECTOMY      Immunization History  Administered Date(s) Administered   Fluad Quad(high Dose 65+) 04/03/2019   Influenza Split 03/19/2016, 04/11/2018, 04/03/2019   Influenza, High Dose Seasonal PF 03/26/2020   Influenza-Unspecified 03/22/2014, 03/26/2020   PFIZER Comirnaty(Gray Top)Covid-19 Tri-Sucrose Vaccine 10/04/2020   PFIZER(Purple Top)SARS-COV-2 Vaccination 08/03/2019, 08/31/2019, 04/01/2020    Recent Results (from the past 2160 hour(s))  COMPLETE METABOLIC PANEL WITH GFR     Status: Abnormal   Collection Time: 12/31/20 11:20 AM  Result Value Ref Range   Glucose, Bld 126 65 - 139 mg/dL    Comment: .        Non-fasting reference interval .    BUN 10 7 - 25 mg/dL   Creat 2.97 (L) 9.89 - 0.93 mg/dL    Comment: For patients >101 years of age, the reference limit for Creatinine is approximately 13% higher for people identified as African-American. .    GFR, Est Non African American 93 > OR = 60 mL/min/1.31m2   GFR, Est African American 107 > OR = 60 mL/min/1.45m2   BUN/Creatinine Ratio 18 6 - 22 (calc)   Sodium 141 135 - 146 mmol/L   Potassium 4.0 3.5 - 5.3 mmol/L   Chloride 104 98 - 110 mmol/L   CO2 29 20 - 32 mmol/L   Calcium 10.0 8.6 - 10.4 mg/dL   Total Protein 6.9 6.1 - 8.1 g/dL   Albumin 4.3 3.6 - 5.1 g/dL   Globulin 2.6 1.9 - 3.7 g/dL (calc)   AG Ratio 1.7 1.0 - 2.5 (calc)   Total Bilirubin 0.7 0.2 - 1.2 mg/dL   Alkaline phosphatase  (APISO) 64 37 - 153 U/L   AST 17 10 - 35 U/L   ALT 15 6 - 29 U/L  Hemoglobin A1c     Status: None   Collection Time: 12/31/20 11:21 AM  Result Value Ref Range   Hgb A1c MFr Bld 5.1 <5.7 % of total Hgb    Comment: For the purpose of screening for the presence of diabetes: . <5.7%       Consistent with the absence of diabetes 5.7-6.4%    Consistent with increased risk for diabetes             (prediabetes) > or =6.5%  Consistent with diabetes . This assay result is consistent with  a decreased risk of diabetes. . Currently, no consensus exists regarding use of hemoglobin A1c for diagnosis of diabetes in children. . According to American Diabetes Association (ADA) guidelines, hemoglobin A1c <7.0% represents optimal control in non-pregnant diabetic patients. Different metrics may apply to specific patient populations.  Standards of Medical Care in Diabetes(ADA). .    Mean Plasma Glucose 100 mg/dL   eAG (mmol/L) 5.5 mmol/L    No results found.     All questions at time of visit were answered - patient instructed to contact office with any additional concerns or updates. ER/RTC precautions were reviewed with the patient as applicable.   Please note: manual typing as well as voice recognition software may have been used to produce this document - typos may escape review. Please contact Dr. Lyn Hollingshead for any needed clarifications.   Total encounter time on date of service, 02/18/21, was 10 minutes spent addressing problems/issues as noted above in Assessment & Plan, including time spent in discussion with patient regarding the HPI, ROS, confirming history, reviewing Assessment & Plan, as well as time spent on coordination of care, record review.

## 2021-02-24 ENCOUNTER — Other Ambulatory Visit: Payer: Self-pay | Admitting: Physician Assistant

## 2021-02-24 ENCOUNTER — Telehealth: Payer: Self-pay | Admitting: Neurology

## 2021-02-24 DIAGNOSIS — H6983 Other specified disorders of Eustachian tube, bilateral: Secondary | ICD-10-CM

## 2021-02-24 NOTE — Telephone Encounter (Addendum)
She was given atrovent by Dr. Lyn Hollingshead. I asked patient if she would like Flonase RX, she has this at home and will try until she gets visit with ENT.

## 2021-02-24 NOTE — Telephone Encounter (Signed)
Patient seen 02/17/2021 by Dr. Lyn Hollingshead for ear pain/feeling of fluid in ears/hearing difficulty. She finished prednisone which helped with pain but still has feeling of fluid and difficulty hearing. Okay for referral to ENT? Please sign if appropriate.

## 2021-02-24 NOTE — Telephone Encounter (Signed)
I will sign. Flonase daily can help as well. Does she need rx?

## 2021-03-05 ENCOUNTER — Encounter: Payer: Self-pay | Admitting: Family Medicine

## 2021-04-02 DIAGNOSIS — H906 Mixed conductive and sensorineural hearing loss, bilateral: Secondary | ICD-10-CM | POA: Diagnosis not present

## 2021-04-02 DIAGNOSIS — H6983 Other specified disorders of Eustachian tube, bilateral: Secondary | ICD-10-CM | POA: Diagnosis not present

## 2021-04-30 DIAGNOSIS — H6123 Impacted cerumen, bilateral: Secondary | ICD-10-CM | POA: Diagnosis not present

## 2021-04-30 DIAGNOSIS — J343 Hypertrophy of nasal turbinates: Secondary | ICD-10-CM | POA: Diagnosis not present

## 2021-04-30 DIAGNOSIS — J310 Chronic rhinitis: Secondary | ICD-10-CM | POA: Diagnosis not present

## 2021-04-30 DIAGNOSIS — H903 Sensorineural hearing loss, bilateral: Secondary | ICD-10-CM | POA: Diagnosis not present

## 2021-04-30 DIAGNOSIS — H6981 Other specified disorders of Eustachian tube, right ear: Secondary | ICD-10-CM | POA: Diagnosis not present

## 2021-05-01 ENCOUNTER — Ambulatory Visit (INDEPENDENT_AMBULATORY_CARE_PROVIDER_SITE_OTHER): Payer: PPO | Admitting: Physician Assistant

## 2021-05-01 ENCOUNTER — Other Ambulatory Visit: Payer: Self-pay

## 2021-05-01 ENCOUNTER — Ambulatory Visit (INDEPENDENT_AMBULATORY_CARE_PROVIDER_SITE_OTHER): Payer: PPO

## 2021-05-01 VITALS — BP 198/87 | HR 90

## 2021-05-01 DIAGNOSIS — M79642 Pain in left hand: Secondary | ICD-10-CM

## 2021-05-01 DIAGNOSIS — W19XXXA Unspecified fall, initial encounter: Secondary | ICD-10-CM | POA: Diagnosis not present

## 2021-05-01 DIAGNOSIS — S6292XA Unspecified fracture of left wrist and hand, initial encounter for closed fracture: Secondary | ICD-10-CM | POA: Insufficient documentation

## 2021-05-01 DIAGNOSIS — M8000XA Age-related osteoporosis with current pathological fracture, unspecified site, initial encounter for fracture: Secondary | ICD-10-CM | POA: Diagnosis not present

## 2021-05-01 DIAGNOSIS — S62617A Displaced fracture of proximal phalanx of left little finger, initial encounter for closed fracture: Secondary | ICD-10-CM | POA: Diagnosis not present

## 2021-05-01 MED ORDER — TRAMADOL HCL 50 MG PO TABS: 50.0000 mg | ORAL_TABLET | Freq: Three times a day (TID) | ORAL | 0 refills | Status: DC | PRN

## 2021-05-01 NOTE — Progress Notes (Signed)
   Subjective:    Patient ID: Regina Walters, female    DOB: 02-27-1946, 75 y.o.   MRN: [id number]  HPI Pt is a 75 yo female with osteoporosis who fell this morning on her left hand and having pain.   Last prolia 01/04/2021.    .. Active Ambulatory Problems    Diagnosis Date Noted   Acute on chronic cholecystitis s/p lap cholecystectomy 04/21/2019 04/10/2019   Gastroesophageal reflux disease 04/10/2019   Liver cyst 04/09/2013   Displaced fracture of proximal end of left humerus 06/09/2019   Elevated blood pressure reading without diagnosis of hypertension 10/11/2019   Right carotid bruit 10/11/2019   Pulsatile neck mass 10/13/2019   Systolic murmur 10/13/2019   Mixed hyperlipidemia 10/13/2019   Anxiety 05/20/2020   Primary insomnia 05/20/2020   Osteoporosis 01/03/2021   Fractured hand, left, closed, initial encounter 05/01/2021   Resolved Ambulatory Problems    Diagnosis Date Noted   No Resolved Ambulatory Problems   Past Medical History:  Diagnosis Date   History of kidney stones    Kidney stone on left side 2014     Review of Systems See HPI.     Objective:   Physical Exam Vitals reviewed.  Musculoskeletal:     Comments: Bruised and swollen left 3rd, 4th, 5th MCP of left hand with bruising and tenderness.   Neurological:     Mental Status: She is alert.          Assessment & Plan:  Marland KitchenMarland KitchenVelva was seen today for fall.  Diagnoses and all orders for this visit:  Fall, initial encounter -     DG Hand Complete Left; Future  Pain of left hand -     DG Hand Complete Left; Future  Age-related osteoporosis with current pathological fracture, initial encounter  Fractured hand, left, closed, initial encounter -     DG Hand Complete Left; Future -     traMADol (ULTRAM) 50 MG tablet; Take 1-2 tablets (50-100 mg total) by mouth every 8 (eight) hours as needed for moderate pain. Maximum 6 tabs per day.  Discussed importance of osteoporosis treatment.   Continue on vitamin D and calcium.  Next prolia due 07/07/2021.   Stat xray showed multiple base of MCP fractures. Sent to Dr. Karie Schwalbe, sports medicine for management.

## 2021-05-01 NOTE — Progress Notes (Signed)
    Procedures performed today:    Ulnar gutter splint applied today.  Independent interpretation of notes and tests performed by another provider:   I personally reviewed the x-rays that showed nondisplaced, minimally angulated fractures through the bases of the third, fourth, fifth proximal phalanges of the left hand.  Brief History, Exam, Impression, and Recommendations:    Fractured hand, left, closed, initial encounter This is a pleasant 75 year old female, today she had a fall, fell on her left hand, pain at the third, fourth, fifth phalanges, x-rays showed nondisplaced, minimally angulated fractures of the base of the third, fourth, and fifth proximal phalanges. She swollen, neurovascularly intact distally. Today we applied an ulnar gutter splint for immobilization, adding tramadol for pain relief, expect 6 to 8 weeks for healing considering her osteoporosis. I like to see her back in about 2 weeks, tramadol for pain. X-ray before visit.    ___________________________________________ Ihor Austin. Benjamin Stain, M.D., ABFM., CAQSM. Primary Care and Sports Medicine [place] [facility]  Adjunct Instructor of Family Medicine  [employer]

## 2021-05-01 NOTE — Assessment & Plan Note (Signed)
This is a pleasant 76 year old female, today she had a fall, fell on her left hand, pain at the third, fourth, fifth phalanges, x-rays showed nondisplaced, minimally angulated fractures of the base of the third, fourth, and fifth proximal phalanges. She swollen, neurovascularly intact distally. Today we applied an ulnar gutter splint for immobilization, adding tramadol for pain relief, expect 6 to 8 weeks for healing considering her osteoporosis. I like to see her back in about 2 weeks, tramadol for pain. X-ray before visit.

## 2021-05-09 ENCOUNTER — Ambulatory Visit: Payer: PPO | Admitting: Sports Medicine

## 2021-05-09 ENCOUNTER — Ambulatory Visit: Payer: PPO | Admitting: Physician Assistant

## 2021-05-09 ENCOUNTER — Ambulatory Visit (INDEPENDENT_AMBULATORY_CARE_PROVIDER_SITE_OTHER): Payer: PPO

## 2021-05-09 ENCOUNTER — Other Ambulatory Visit: Payer: Self-pay

## 2021-05-09 ENCOUNTER — Ambulatory Visit (INDEPENDENT_AMBULATORY_CARE_PROVIDER_SITE_OTHER): Payer: PPO | Admitting: Sports Medicine

## 2021-05-09 DIAGNOSIS — S6292XA Unspecified fracture of left wrist and hand, initial encounter for closed fracture: Secondary | ICD-10-CM | POA: Diagnosis not present

## 2021-05-09 DIAGNOSIS — S62647A Nondisplaced fracture of proximal phalanx of left little finger, initial encounter for closed fracture: Secondary | ICD-10-CM | POA: Diagnosis not present

## 2021-05-09 DIAGNOSIS — S62645A Nondisplaced fracture of proximal phalanx of left ring finger, initial encounter for closed fracture: Secondary | ICD-10-CM | POA: Diagnosis not present

## 2021-05-09 NOTE — Assessment & Plan Note (Signed)
This is a very pleasant 75 year old female, she is 1 week post fractures of her left third, fourth, and fifth proximal phalangeal bases, doing well in an ulnar gutter splint. Splint is removed, bruising is still present but she is not really all that tender over the fracture sites, x-rays do show stability of the fracture, return to see me in 2 weeks, continue splint for now, we will probably continue the splint long-term rather than apply cast.

## 2021-05-09 NOTE — Progress Notes (Signed)
    Procedures performed today:    None.  Independent interpretation of notes and tests performed by another provider:   X-rays personally reviewed, fractures appear stable  Brief History, Exam, Impression, and Recommendations:    Fractured hand, left, closed, initial encounter This is a very pleasant 75 year old female, she is 1 week post fractures of her left third, fourth, and fifth proximal phalangeal bases, doing well in an ulnar gutter splint. Splint is removed, bruising is still present but she is not really all that tender over the fracture sites, x-rays do show stability of the fracture, return to see me in 2 weeks, continue splint for now, we will probably continue the splint long-term rather than apply cast.    ___________________________________________ Ihor Austin. Benjamin Stain, M.D., ABFM., CAQSM. Primary Care and Sports Medicine [place] [facility]  Adjunct Instructor of Family Medicine  [employer]

## 2021-05-22 ENCOUNTER — Ambulatory Visit (INDEPENDENT_AMBULATORY_CARE_PROVIDER_SITE_OTHER): Payer: PPO | Admitting: Sports Medicine

## 2021-05-22 ENCOUNTER — Other Ambulatory Visit: Payer: Self-pay

## 2021-05-22 ENCOUNTER — Ambulatory Visit (INDEPENDENT_AMBULATORY_CARE_PROVIDER_SITE_OTHER): Payer: PPO

## 2021-05-22 ENCOUNTER — Other Ambulatory Visit: Payer: Self-pay | Admitting: Sports Medicine

## 2021-05-22 DIAGNOSIS — S62613A Displaced fracture of proximal phalanx of left middle finger, initial encounter for closed fracture: Secondary | ICD-10-CM | POA: Diagnosis not present

## 2021-05-22 DIAGNOSIS — S62617A Displaced fracture of proximal phalanx of left little finger, initial encounter for closed fracture: Secondary | ICD-10-CM

## 2021-05-22 DIAGNOSIS — S6292XA Unspecified fracture of left wrist and hand, initial encounter for closed fracture: Secondary | ICD-10-CM | POA: Diagnosis not present

## 2021-05-22 DIAGNOSIS — S62615A Displaced fracture of proximal phalanx of left ring finger, initial encounter for closed fracture: Secondary | ICD-10-CM | POA: Diagnosis not present

## 2021-05-22 DIAGNOSIS — M810 Age-related osteoporosis without current pathological fracture: Secondary | ICD-10-CM | POA: Diagnosis not present

## 2021-05-22 DIAGNOSIS — T148XXA Other injury of unspecified body region, initial encounter: Secondary | ICD-10-CM

## 2021-05-22 NOTE — Assessment & Plan Note (Signed)
This is a very pleasant 75 year old female, she is now approximately 3 weeks post fractures of the left third, fourth, and fifth proximal phalangeal bases, she has been in an ulnar gutter splint, she still has some bruising today, swelling is improved, only minimal tenderness over the fractures, x-rays personally reviewed, they do show stability of the fractures. Continue splint for 3 more weeks, I did advise that she will probably need some formal physical therapy to get motion back in her hands afterwards.

## 2021-05-22 NOTE — Progress Notes (Signed)
    Procedures performed today:    None.  Independent interpretation of notes and tests performed by another provider:   X-rays personally reviewed, they do show stability of the fractures  Brief History, Exam, Impression, and Recommendations:    Fractured hand, left, closed, initial encounter This is a very pleasant 75 year old female, she is now approximately 3 weeks post fractures of the left third, fourth, and fifth proximal phalangeal bases, she has been in an ulnar gutter splint, she still has some bruising today, swelling is improved, only minimal tenderness over the fractures, x-rays personally reviewed, they do show stability of the fractures. Continue splint for 3 more weeks, I did advise that she will probably need some formal physical therapy to get motion back in her hands afterwards.    ___________________________________________ Ihor Austin. Benjamin Stain, M.D., ABFM., CAQSM. Primary Care and Sports Medicine  [facility]  Adjunct Instructor of Family Medicine  [employer]

## 2021-05-27 ENCOUNTER — Other Ambulatory Visit: Payer: Self-pay | Admitting: *Deleted

## 2021-05-27 ENCOUNTER — Other Ambulatory Visit: Payer: Self-pay

## 2021-05-27 MED ORDER — ATORVASTATIN CALCIUM 10 MG PO TABS: 10.0000 mg | ORAL_TABLET | Freq: Every day | ORAL | 1 refills | Status: DC

## 2021-05-28 DIAGNOSIS — H903 Sensorineural hearing loss, bilateral: Secondary | ICD-10-CM | POA: Diagnosis not present

## 2021-05-28 DIAGNOSIS — H6981 Other specified disorders of Eustachian tube, right ear: Secondary | ICD-10-CM | POA: Diagnosis not present

## 2021-05-28 DIAGNOSIS — H6521 Chronic serous otitis media, right ear: Secondary | ICD-10-CM | POA: Diagnosis not present

## 2021-05-28 DIAGNOSIS — J310 Chronic rhinitis: Secondary | ICD-10-CM | POA: Diagnosis not present

## 2021-06-12 ENCOUNTER — Ambulatory Visit (INDEPENDENT_AMBULATORY_CARE_PROVIDER_SITE_OTHER): Payer: PPO | Admitting: Sports Medicine

## 2021-06-12 ENCOUNTER — Other Ambulatory Visit: Payer: Self-pay

## 2021-06-12 DIAGNOSIS — S6292XA Unspecified fracture of left wrist and hand, initial encounter for closed fracture: Secondary | ICD-10-CM

## 2021-06-12 NOTE — Progress Notes (Signed)
    Procedures performed today:    None.  Independent interpretation of notes and tests performed by another provider:   None.  Brief History, Exam, Impression, and Recommendations:    Fractured hand, left, closed, initial encounter This is a very pleasant 75 year old female returns, she is now about 6 weeks post fractures of the left third, fourth, and fifth proximal phalangeal bases, she has been in an ulnar gutter splint, this will be discontinued today, she can wear it at night, really does not have any tenderness at the fractures, we do need to start some hand therapy, she prefers to do this at [facility] orthopedics, referral placed. Return to see me in 6 weeks.    ___________________________________________ Ihor Austin. Benjamin Stain, M.D., ABFM., CAQSM. Primary Care and Sports Medicine [place] [facility]  Adjunct Instructor of Family Medicine  [employer]

## 2021-06-12 NOTE — Assessment & Plan Note (Signed)
This is a very pleasant 74 year old female returns, she is now about 6 weeks post fractures of the left third, fourth, and fifth proximal phalangeal bases, she has been in an ulnar gutter splint, this will be discontinued today, she can wear it at night, really does not have any tenderness at the fractures, we do need to start some hand therapy, she prefers to do this at [facility] orthopedics, referral placed. Return to see me in 6 weeks.

## 2021-07-01 ENCOUNTER — Telehealth: Payer: Self-pay | Admitting: Neurology

## 2021-07-01 DIAGNOSIS — Z1329 Encounter for screening for other suspected endocrine disorder: Secondary | ICD-10-CM

## 2021-07-01 DIAGNOSIS — E782 Mixed hyperlipidemia: Secondary | ICD-10-CM

## 2021-07-01 DIAGNOSIS — E559 Vitamin D deficiency, unspecified: Secondary | ICD-10-CM

## 2021-07-01 DIAGNOSIS — Z79899 Other long term (current) drug therapy: Secondary | ICD-10-CM

## 2021-07-01 DIAGNOSIS — R030 Elevated blood-pressure reading, without diagnosis of hypertension: Secondary | ICD-10-CM

## 2021-07-01 NOTE — Telephone Encounter (Signed)
Needs labs prior to Prolia injection, due for annual labs. Ordered.

## 2021-07-05 LAB — CBC WITH DIFFERENTIAL/PLATELET
Absolute Monocytes: 291 cells/uL (ref 200–950)
Basophils Absolute: 50 cells/uL (ref 0–200)
Basophils Relative: 0.9 %
Eosinophils Absolute: 129 cells/uL (ref 15–500)
Eosinophils Relative: 2.3 %
HCT: 39.5 % (ref 35.0–45.0)
Hemoglobin: 13.4 g/dL (ref 11.7–15.5)
Lymphs Abs: 1299 cells/uL (ref 850–3900)
MCH: 29 pg (ref 27.0–33.0)
MCHC: 33.9 g/dL (ref 32.0–36.0)
MCV: 85.5 fL (ref 80.0–100.0)
MPV: 11.6 fL (ref 7.5–12.5)
Monocytes Relative: 5.2 %
Neutro Abs: 3830 cells/uL (ref 1500–7800)
Neutrophils Relative %: 68.4 %
Platelets: 205 10*3/uL (ref 140–400)
RBC: 4.62 10*6/uL (ref 3.80–5.10)
RDW: 12.6 % (ref 11.0–15.0)
Total Lymphocyte: 23.2 %
WBC: 5.6 10*3/uL (ref 3.8–10.8)

## 2021-07-05 LAB — COMPLETE METABOLIC PANEL WITH GFR
AG Ratio: 1.5 (calc) (ref 1.0–2.5)
ALT: 13 U/L (ref 6–29)
AST: 16 U/L (ref 10–35)
Albumin: 4 g/dL (ref 3.6–5.1)
Alkaline phosphatase (APISO): 62 U/L (ref 37–153)
BUN/Creatinine Ratio: 15 (calc) (ref 6–22)
BUN: 9 mg/dL (ref 7–25)
CO2: 32 mmol/L (ref 20–32)
Calcium: 9.7 mg/dL (ref 8.6–10.4)
Chloride: 103 mmol/L (ref 98–110)
Creat: 0.59 mg/dL — ABNORMAL LOW (ref 0.60–1.00)
Globulin: 2.7 g/dL (calc) (ref 1.9–3.7)
Glucose, Bld: 102 mg/dL — ABNORMAL HIGH (ref 65–99)
Potassium: 2.9 mmol/L — ABNORMAL LOW (ref 3.5–5.3)
Sodium: 145 mmol/L (ref 135–146)
Total Bilirubin: 0.5 mg/dL (ref 0.2–1.2)
Total Protein: 6.7 g/dL (ref 6.1–8.1)
eGFR: 95 mL/min/{1.73_m2} (ref >=60)

## 2021-07-05 LAB — LIPID PANEL W/REFLEX DIRECT LDL
Cholesterol: 187 mg/dL (ref <200)
HDL: 65 mg/dL (ref >=50)
LDL Cholesterol (Calc): 102 mg/dL (calc) — ABNORMAL HIGH
Non-HDL Cholesterol (Calc): 122 mg/dL (calc) (ref <130)
Total CHOL/HDL Ratio: 2.9 (calc) (ref <5.0)
Triglycerides: 100 mg/dL (ref <150)

## 2021-07-05 LAB — TSH: TSH: 2.85 mIU/L (ref 0.40–4.50)

## 2021-07-05 LAB — VITAMIN D 25 HYDROXY (VIT D DEFICIENCY, FRACTURES): Vit D, 25-Hydroxy: 98 ng/mL (ref 30–100)

## 2021-07-08 ENCOUNTER — Other Ambulatory Visit: Payer: Self-pay | Admitting: Physician Assistant

## 2021-07-08 NOTE — Progress Notes (Signed)
Thyroid looks good.  Vitamin D looks great.  Potassium is low. How is she feeling? Bring in for stat recheck today.  Cholesterol is stable and looks good.

## 2021-07-09 ENCOUNTER — Encounter: Payer: Self-pay | Admitting: Physician Assistant

## 2021-07-09 ENCOUNTER — Other Ambulatory Visit: Payer: Self-pay

## 2021-07-09 ENCOUNTER — Ambulatory Visit (INDEPENDENT_AMBULATORY_CARE_PROVIDER_SITE_OTHER): Payer: PPO | Admitting: Physician Assistant

## 2021-07-09 ENCOUNTER — Ambulatory Visit: Payer: PPO | Admitting: Physician Assistant

## 2021-07-09 ENCOUNTER — Other Ambulatory Visit: Payer: Self-pay | Admitting: Neurology

## 2021-07-09 VITALS — BP 188/87 | HR 99 | Ht <= 58 in

## 2021-07-09 DIAGNOSIS — E876 Hypokalemia: Secondary | ICD-10-CM | POA: Diagnosis not present

## 2021-07-09 DIAGNOSIS — R413 Other amnesia: Secondary | ICD-10-CM | POA: Diagnosis not present

## 2021-07-09 DIAGNOSIS — M8000XA Age-related osteoporosis with current pathological fracture, unspecified site, initial encounter for fracture: Secondary | ICD-10-CM

## 2021-07-09 LAB — POTASSIUM: Potassium: 2.9 mmol/L — ABNORMAL LOW (ref 3.5–5.3)

## 2021-07-09 MED ORDER — DENOSUMAB 60 MG/ML ~~LOC~~ SOSY
60.0000 mg | PREFILLED_SYRINGE | Freq: Once | SUBCUTANEOUS | Status: AC
  Administered 2021-07-09: 60 mg via SUBCUTANEOUS

## 2021-07-09 NOTE — Addendum Note (Signed)
Addended bySilvio Pate on: 07/09/2021 02:52 PM   Modules accepted: Orders

## 2021-07-09 NOTE — Progress Notes (Signed)
Potassium is still low and same as 5 days ago. Are you taking magnesium citrate every day? What medication have you added in the last 6 months? Prevagen?  What mg are your supplements as well?

## 2021-07-09 NOTE — Progress Notes (Signed)
° °  Subjective:    Patient ID: Regina Walters, female    DOB: [birth year], 76 y.o.   MRN: [id number]  HPI Pt is a 76 yo female who presents to the clinic to have prolia shot done today. She also had labs that showed potassium 2.9. she denies any hx of potassium issues or new medications. No cramps, SOB, CP, or palpitations.   .. Active Ambulatory Problems    Diagnosis Date Noted   Acute on chronic cholecystitis s/p lap cholecystectomy 04/21/2019 04/10/2019   Gastroesophageal reflux disease 04/10/2019   Liver cyst 04/09/2013   Displaced fracture of proximal end of left humerus 06/09/2019   Elevated blood pressure reading without diagnosis of hypertension 10/11/2019   Right carotid bruit 10/11/2019   Pulsatile neck mass 10/13/2019   Systolic murmur 10/13/2019   Mixed hyperlipidemia 10/13/2019   Anxiety 05/20/2020   Primary insomnia 05/20/2020   Osteoporosis 01/03/2021   Fractured hand, left, closed, initial encounter 05/01/2021   Memory changes 07/09/2021   Hypokalemia 07/09/2021   Resolved Ambulatory Problems    Diagnosis Date Noted   No Resolved Ambulatory Problems   Past Medical History:  Diagnosis Date   History of kidney stones    Kidney stone on left side 2014     Review of Systems  All other systems reviewed and are negative.     Objective:   Physical Exam Vitals reviewed.  Constitutional:      Appearance: Normal appearance.  HENT:     Head: Normocephalic.  Cardiovascular:     Rate and Rhythm: Normal rate and regular rhythm.  Pulmonary:     Effort: Pulmonary effort is normal.     Breath sounds: Normal breath sounds.  Musculoskeletal:     Right lower leg: No edema.     Left lower leg: No edema.  Neurological:     General: No focal deficit present.     Mental Status: She is alert and oriented to person, place, and time.  Psychiatric:        Mood and Affect: Mood normal.  MOCA- Score 24        Assessment & Plan:  Marland KitchenMarland KitchenAleeya was seen today for  follow-up.  Diagnoses and all orders for this visit:  Hypokalemia -     Potassium  Age-related osteoporosis with current pathological fracture, initial encounter -     denosumab (PROLIA) injection 60 mg   Recheck potassium today. Discussed with patient could have just been a lab issue. Prolia shot given follow up in 6 months.  MOCA memory screening was 24. Just under normal will recheck in 6 months.

## 2021-07-09 NOTE — Progress Notes (Signed)
Stop prevagen and start potassium supplement one tablet a day recheck in 1 week.

## 2021-07-16 LAB — POTASSIUM: Potassium: 2.9 mmol/L — ABNORMAL LOW (ref 3.5–5.3)

## 2021-07-17 ENCOUNTER — Other Ambulatory Visit: Payer: Self-pay | Admitting: Neurology

## 2021-07-17 ENCOUNTER — Other Ambulatory Visit: Payer: Self-pay | Admitting: Physician Assistant

## 2021-07-17 DIAGNOSIS — E876 Hypokalemia: Secondary | ICD-10-CM

## 2021-07-17 MED ORDER — POTASSIUM CHLORIDE ER 10 MEQ PO TBCR: 10.0000 meq | EXTENDED_RELEASE_TABLET | Freq: Every day | ORAL | 0 refills | Status: DC

## 2021-07-17 NOTE — Progress Notes (Signed)
The same. I am going to send over prescription potassium and recheck labs in one week.

## 2021-07-18 ENCOUNTER — Telehealth: Payer: Self-pay | Admitting: Neurology

## 2021-07-18 ENCOUNTER — Ambulatory Visit: Payer: PPO | Admitting: Physician Assistant

## 2021-07-18 MED ORDER — POTASSIUM CHLORIDE ER 10 MEQ PO CPCR: 10.0000 meq | ORAL_CAPSULE | Freq: Every day | ORAL | 0 refills | Status: DC

## 2021-07-18 NOTE — Telephone Encounter (Signed)
Patient can not swallow potassium tablets, per Lesly Rubenstein okay to send in capsules to open up and take beads. RX sent.

## 2021-08-13 LAB — POTASSIUM: Potassium: 3.8 mmol/L (ref 3.5–5.3)

## 2021-08-14 ENCOUNTER — Other Ambulatory Visit: Payer: Self-pay | Admitting: Neurology

## 2021-08-14 MED ORDER — POTASSIUM CHLORIDE ER 10 MEQ PO CPCR: 10.0000 meq | ORAL_CAPSULE | Freq: Every day | ORAL | 1 refills | Status: DC

## 2021-08-14 NOTE — Progress Notes (Signed)
Perfect. Stay on same dose.

## 2021-08-22 ENCOUNTER — Ambulatory Visit: Payer: PPO | Admitting: Sports Medicine

## 2021-08-30 IMAGING — RF DG CHOLANGIOGRAM OPERATIVE
1 series · 4 of 4 positions shown · non-contrast
Comparison: None

CLINICAL DATA: Intraoperative cholangiogram during laparoscopic
cholecystectomy.

EXAM:
INTRAOPERATIVE CHOLANGIOGRAM
FLUOROSCOPY TIME:  8 seconds

[Series 1: run · 4 of 44 frames shown]
[frame 7/44]
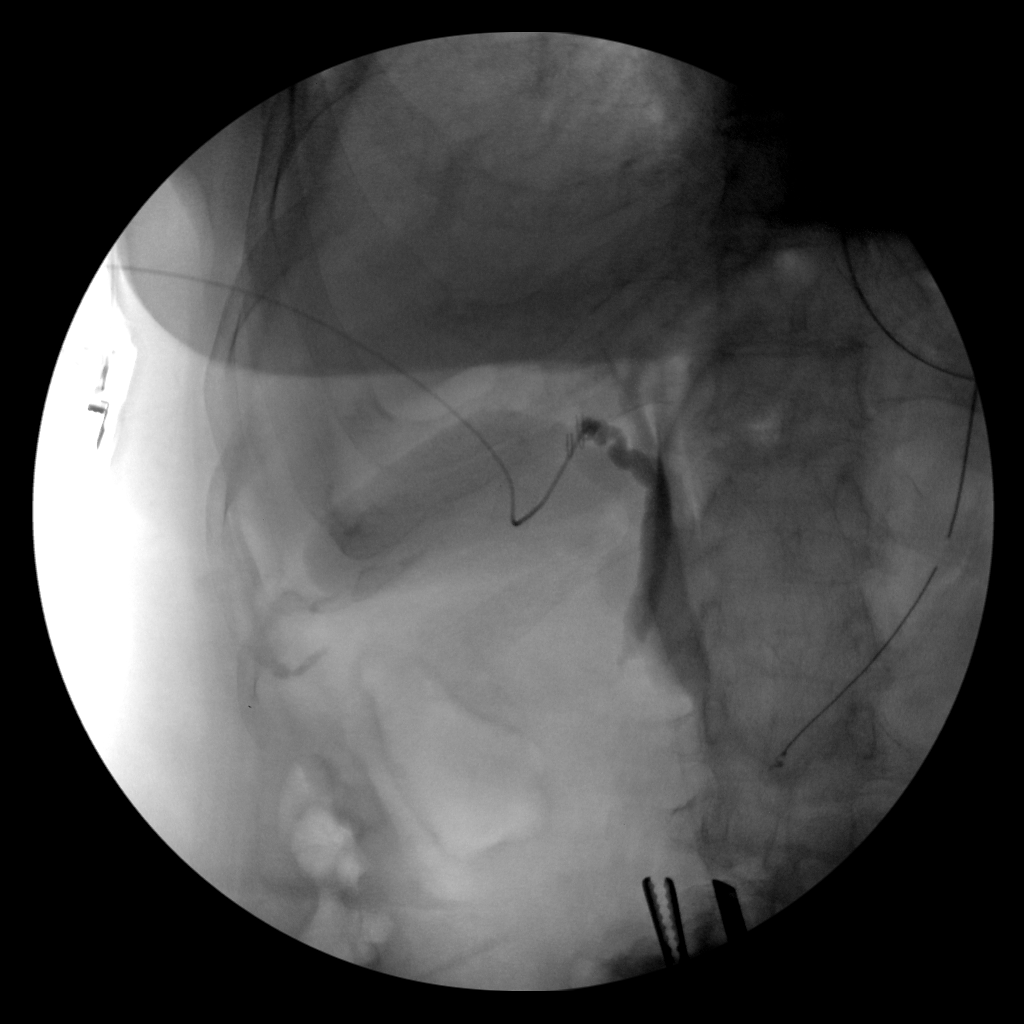
[frame 23/44]
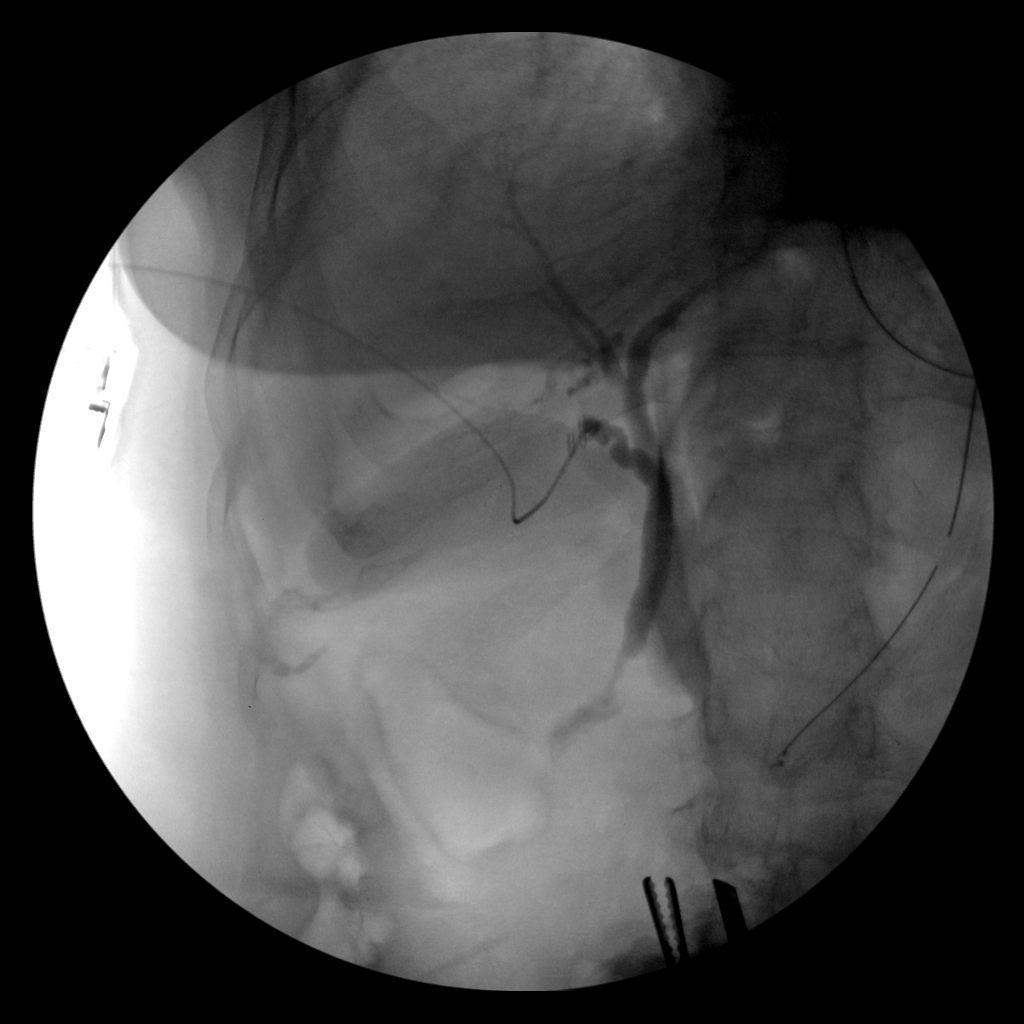
[frame 38/44]
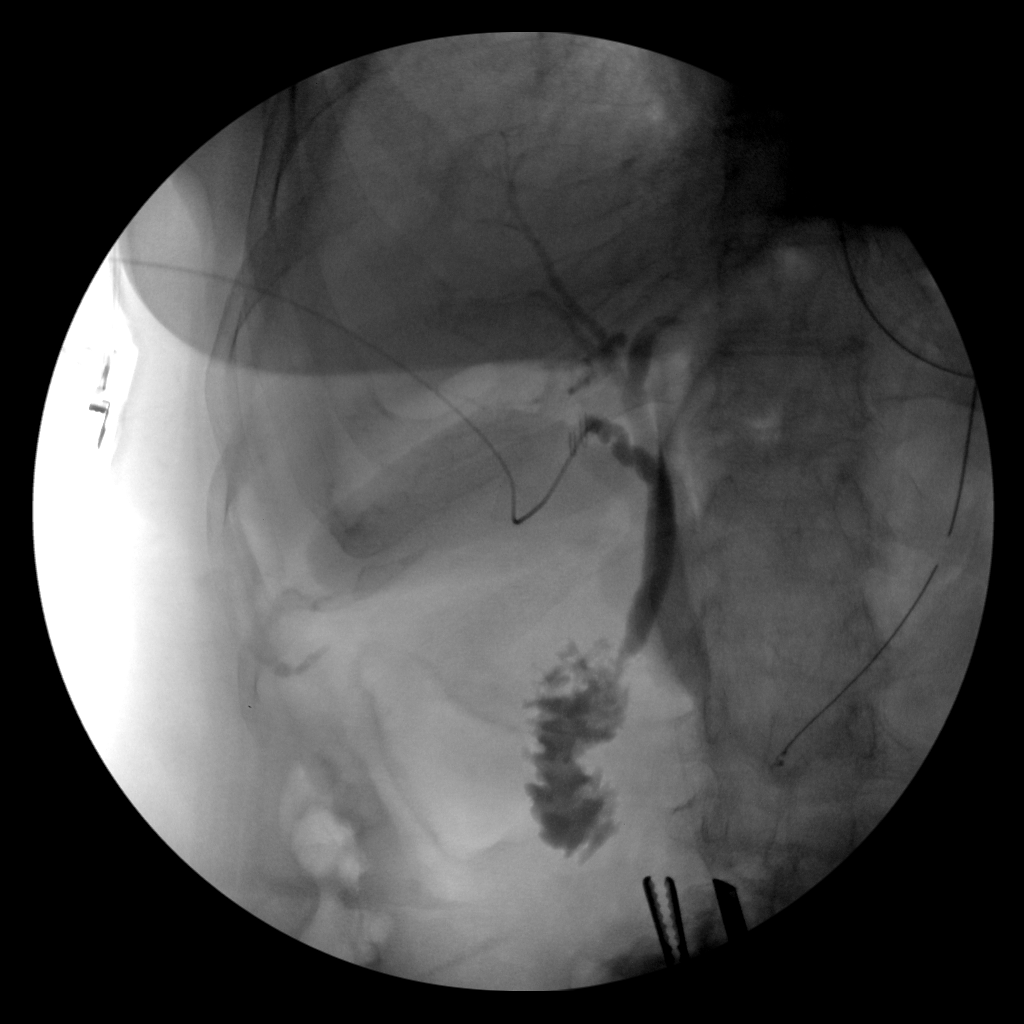
[frame 44/44]
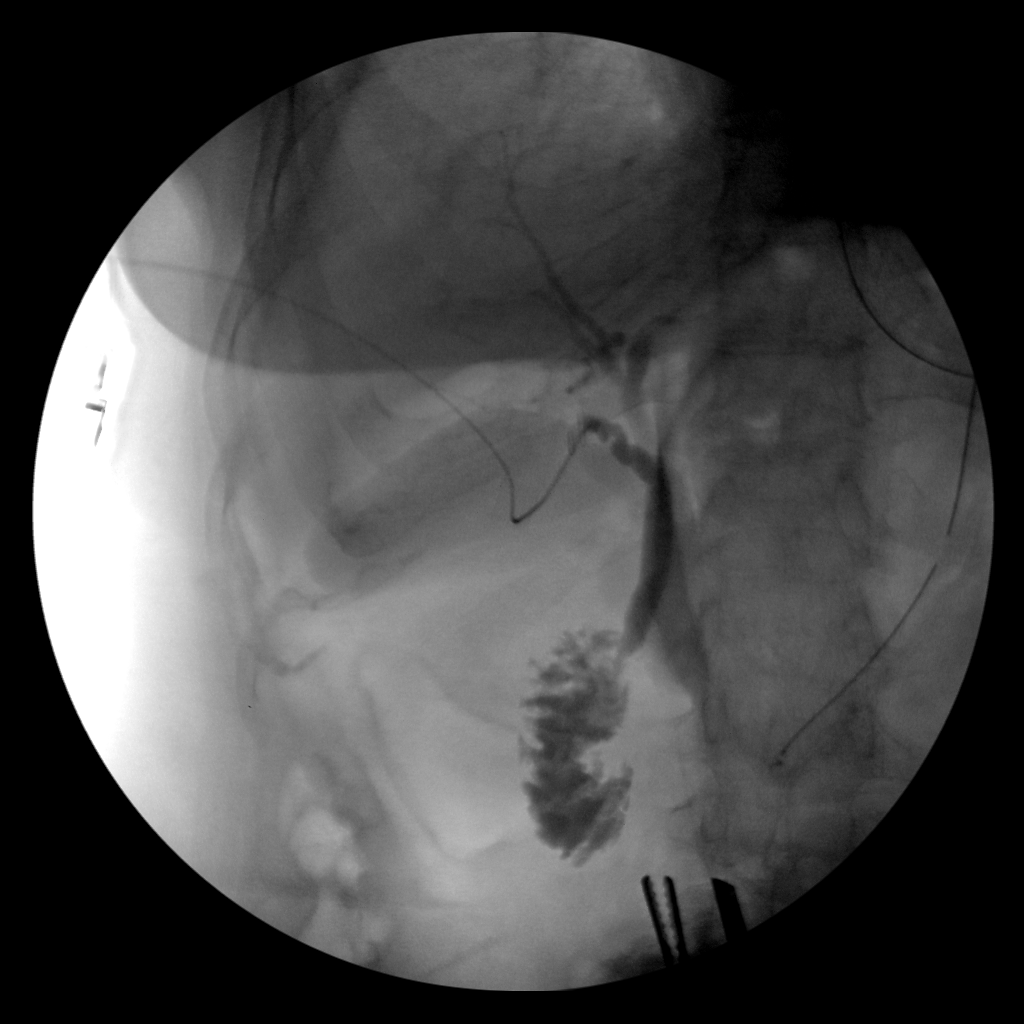

[4 of 4 positions shown; findings below may reference images not displayed]

FINDINGS: Intraoperative cholangiographic images of the right upper abdominal
quadrant during laparoscopic cholecystectomy are provided for
review.

Surgical clips overlie the expected location of the gallbladder
fossa.

Contrast injection demonstrates selective cannulation of the central
aspect of the cystic duct.

There is passage of contrast through the central aspect of the
cystic duct with filling of a non dilated common bile duct. There is
passage of contrast though the CBD and into the descending portion
of the duodenum.

There is minimal reflux of injected contrast into the common hepatic
duct and central aspect of the non dilated intrahepatic biliary
system.

There are no discrete filling defects within the opacified portions
of the biliary system to suggest the presence of
choledocholithiasis.
IMPRESSION: No evidence of choledocholithiasis.

## 2021-10-11 IMAGING — CR DG SHOULDER 2+V*L*
2 series · 2 of 2 positions shown · non-contrast
Comparison: None.

CLINICAL DATA: Left upper arm pain secondary to a fall.

EXAM:
LEFT SHOULDER - 2+ VIEW

[x shoulder ap left (1 of 2)]
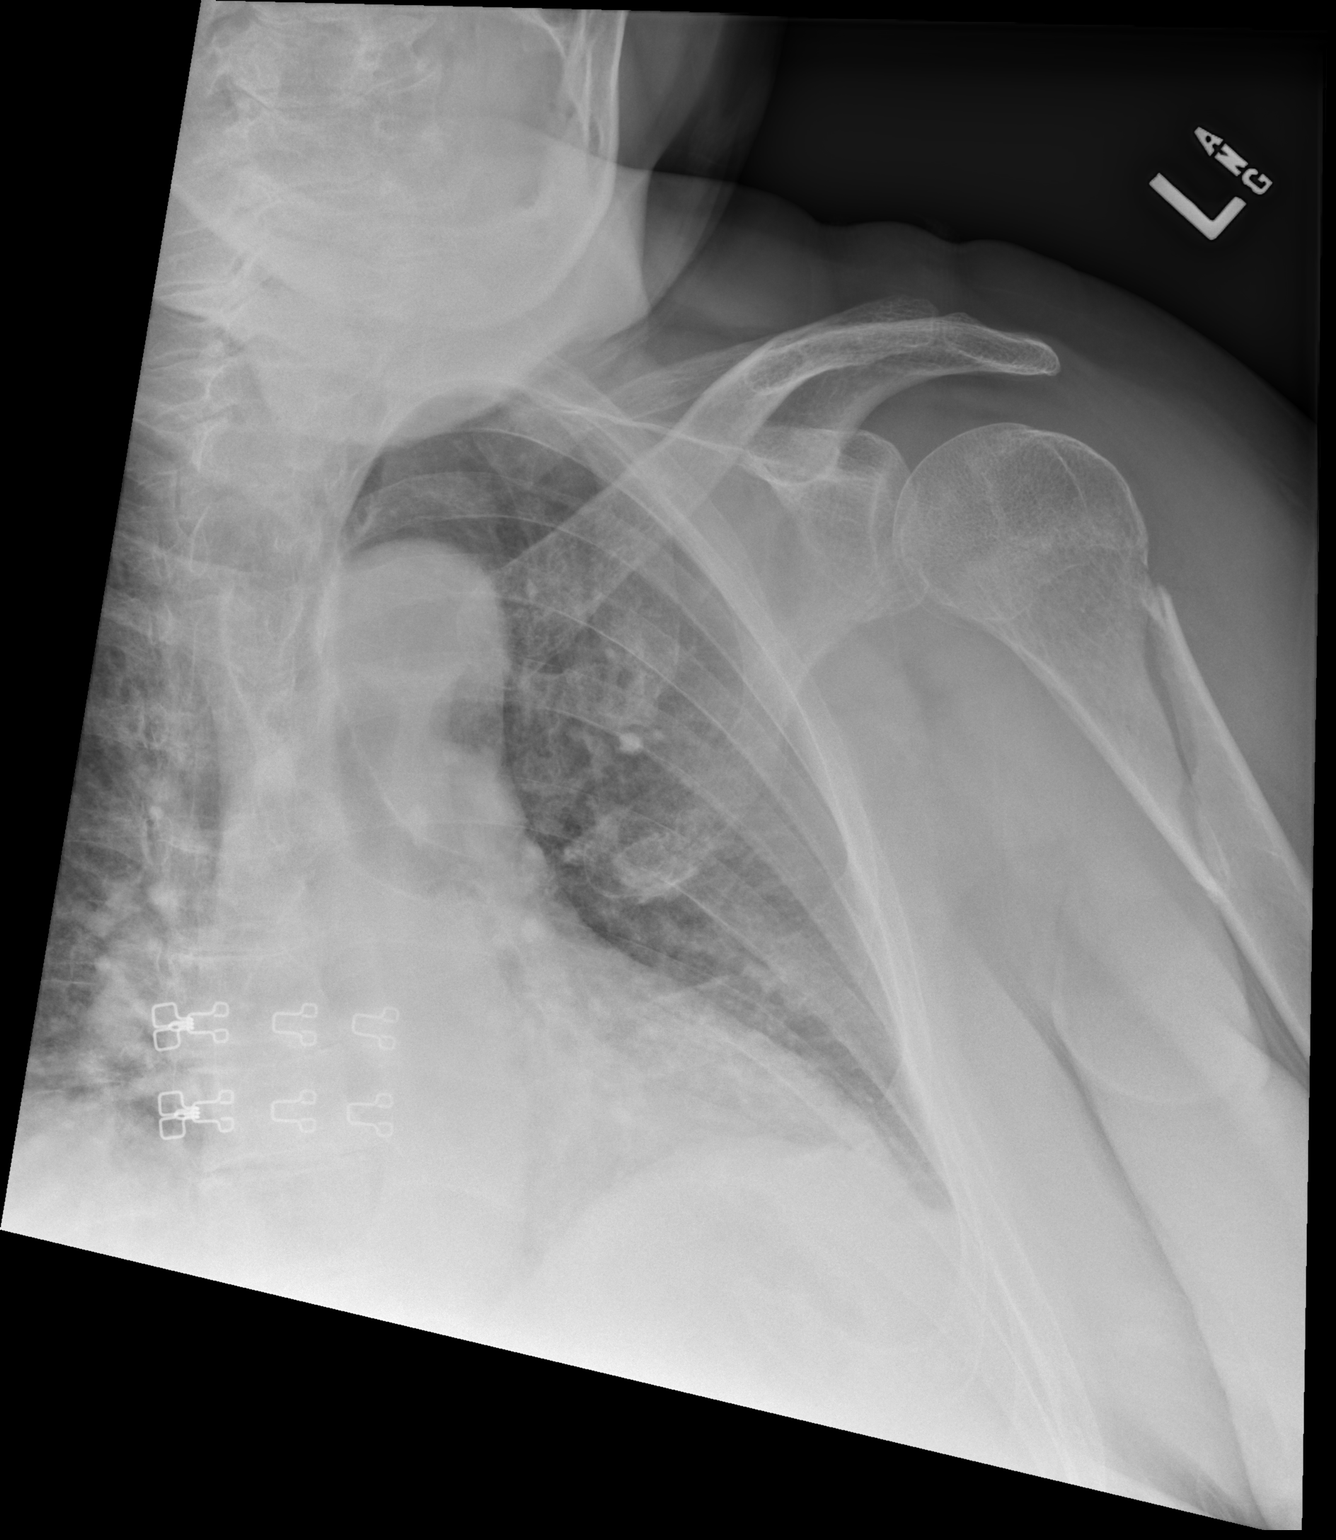

[x shoulder ap left (2 of 2)]
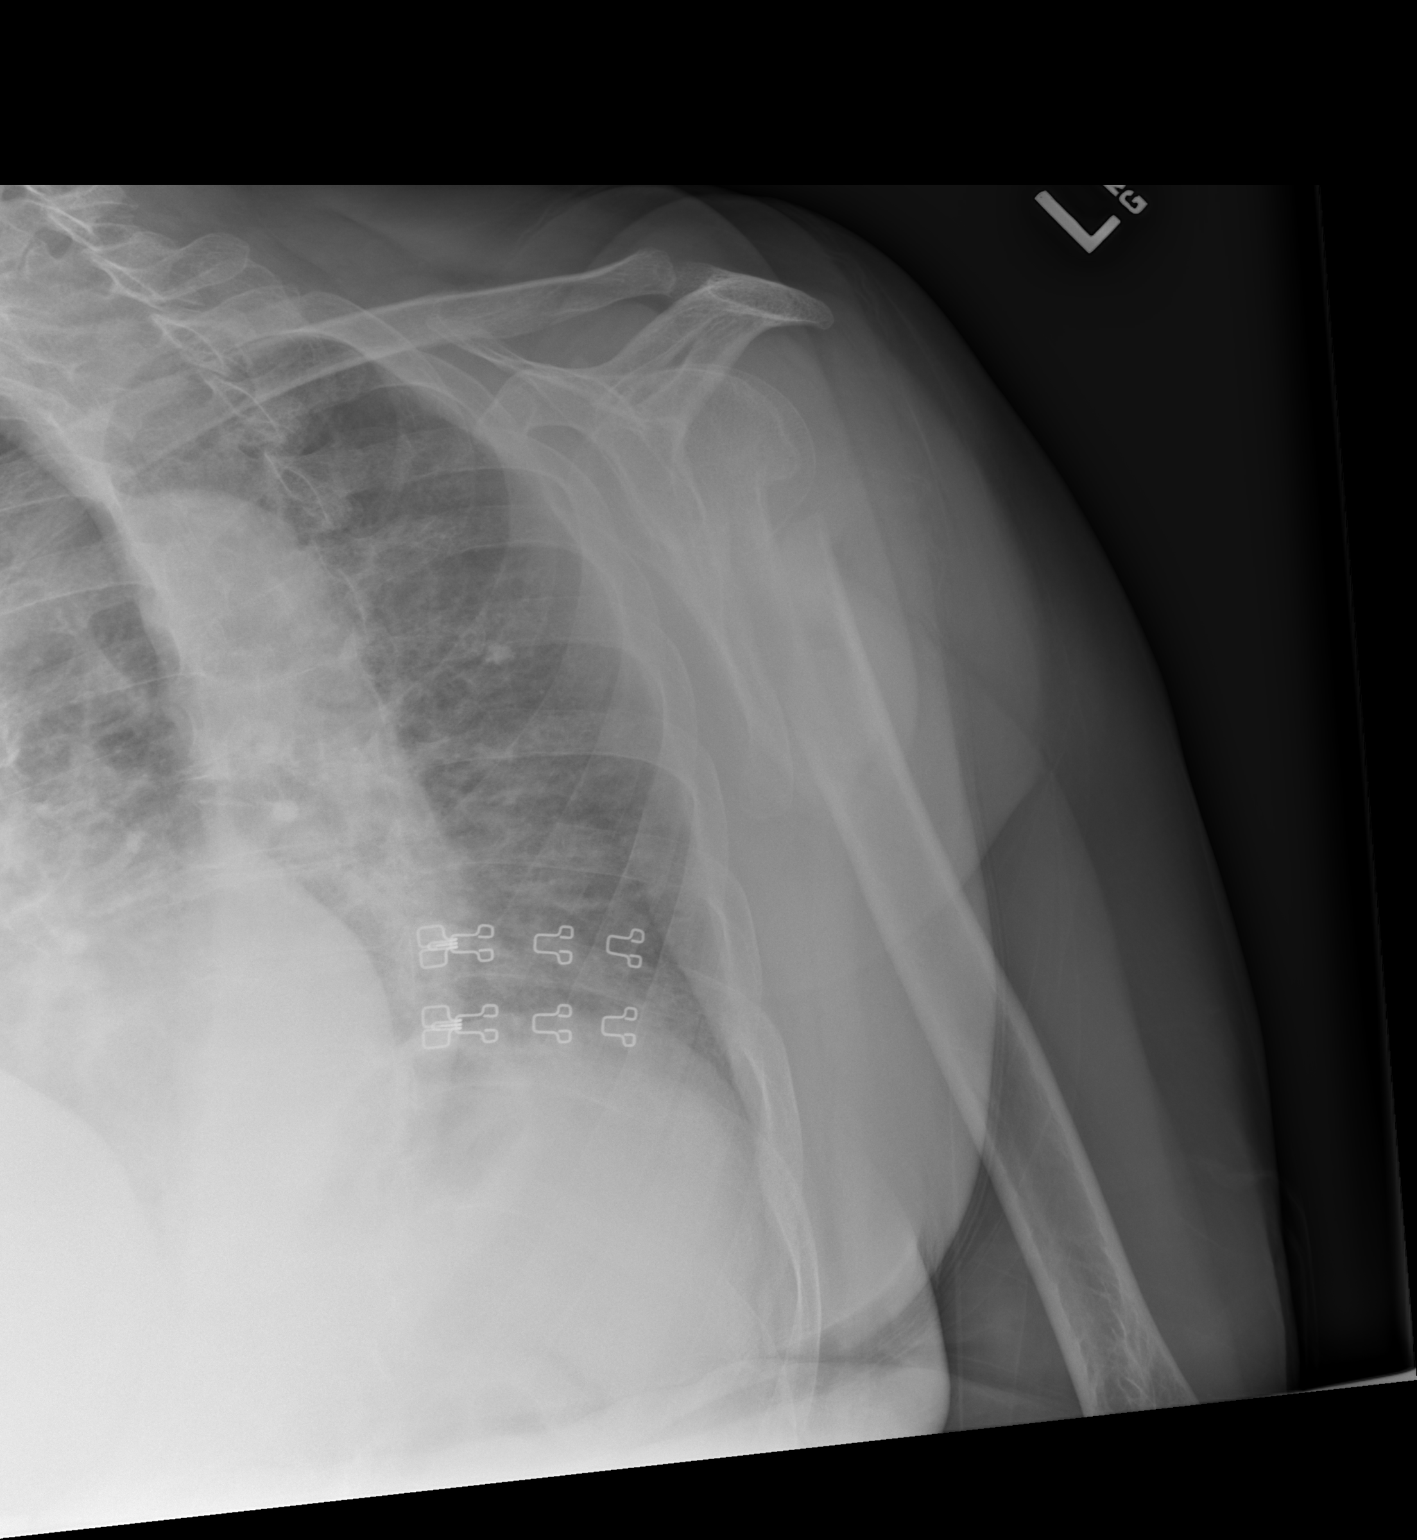

[2 of 2 positions shown; findings below may reference images not displayed]

FINDINGS: There is a slightly displaced spiral fracture of the proximal left
humeral shaft. No dislocation. AC joint and glenohumeral joint
appear normal.
IMPRESSION: Slightly displaced spiral fracture of the proximal left humeral
shaft.

## 2021-10-11 IMAGING — CR DG HUMERUS 2V *L*
2 series · 2 of 2 positions shown · non-contrast
Comparison: None.

CLINICAL DATA: Left arm pain secondary to a fall.

EXAM:
LEFT HUMERUS - 2+ VIEW

[x humerus ap left (1 of 2)]
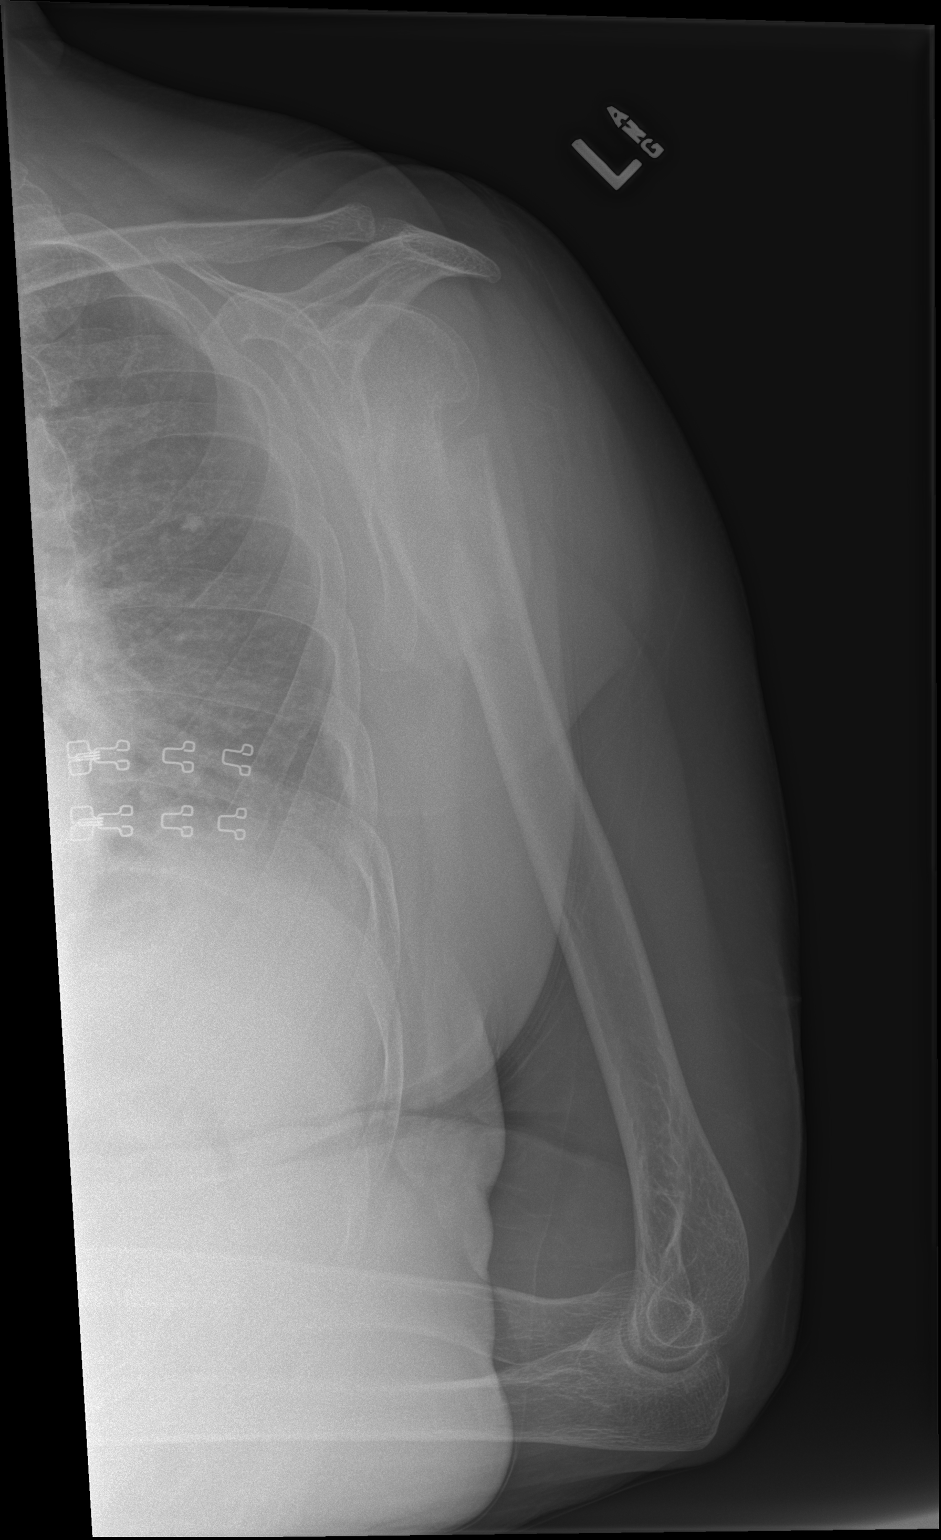

[x humerus ap left (2 of 2)]
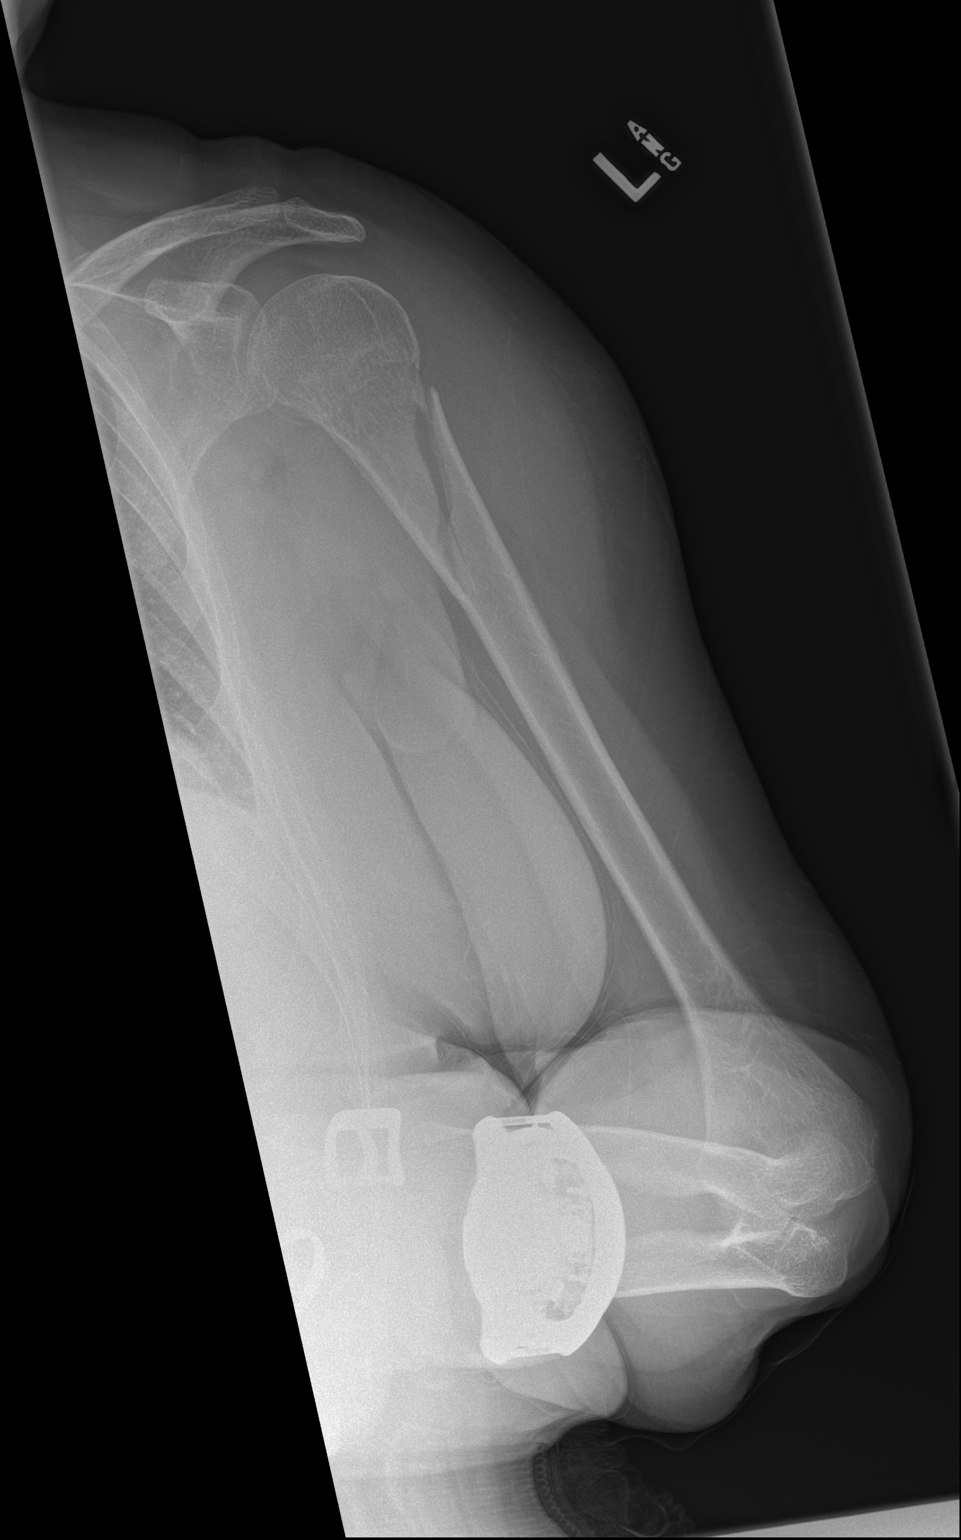

[2 of 2 positions shown; findings below may reference images not displayed]

FINDINGS: There is a spiral fracture of the proximal shaft of the left humerus
with slight displacement. The humeral head is not dislocated. Distal
humerus is normal.
IMPRESSION: Spiral fracture of the proximal shaft of the left humerus with
slight displacement.

## 2021-11-21 ENCOUNTER — Other Ambulatory Visit: Payer: Self-pay | Admitting: Neurology

## 2021-11-21 MED ORDER — ATORVASTATIN CALCIUM 10 MG PO TABS: 10.0000 mg | ORAL_TABLET | Freq: Every day | ORAL | 1 refills | Status: DC

## 2022-01-12 ENCOUNTER — Ambulatory Visit (INDEPENDENT_AMBULATORY_CARE_PROVIDER_SITE_OTHER): Payer: PPO | Admitting: Physician Assistant

## 2022-01-12 VITALS — BP 125/70 | HR 84

## 2022-01-12 DIAGNOSIS — M8000XA Age-related osteoporosis with current pathological fracture, unspecified site, initial encounter for fracture: Secondary | ICD-10-CM

## 2022-01-12 MED ORDER — DENOSUMAB 60 MG/ML ~~LOC~~ SOSY
60.0000 mg | PREFILLED_SYRINGE | Freq: Once | SUBCUTANEOUS | Status: AC
  Administered 2022-01-12: 60 mg via SUBCUTANEOUS

## 2022-01-12 NOTE — Progress Notes (Signed)
Agree with above plan. 

## 2022-01-12 NOTE — Progress Notes (Signed)
Patient is here for Prolia injection. Verified no previous problems with injections. Prolia injection to right arm with no apparent complications. Patient advised to call with any problems.

## 2022-01-21 ENCOUNTER — Other Ambulatory Visit: Payer: Self-pay | Admitting: *Deleted

## 2022-01-21 MED ORDER — POTASSIUM CHLORIDE ER 10 MEQ PO CPCR: 10.0000 meq | ORAL_CAPSULE | Freq: Every day | ORAL | 1 refills | Status: DC

## 2022-02-25 ENCOUNTER — Encounter: Payer: Self-pay | Admitting: General Practice

## 2022-05-23 ENCOUNTER — Other Ambulatory Visit: Payer: Self-pay | Admitting: Physician Assistant

## 2022-06-09 ENCOUNTER — Telehealth: Payer: Self-pay

## 2022-06-09 ENCOUNTER — Other Ambulatory Visit: Payer: Self-pay | Admitting: Neurology

## 2022-06-09 DIAGNOSIS — M8000XA Age-related osteoporosis with current pathological fracture, unspecified site, initial encounter for fracture: Secondary | ICD-10-CM

## 2022-06-09 DIAGNOSIS — E782 Mixed hyperlipidemia: Secondary | ICD-10-CM

## 2022-06-09 NOTE — Progress Notes (Signed)
Patient scheduled for Prolia injection 07-09-2022, patient made aware. Labs ordered for one week prior.   Christal - can you make sure doesn't need insurance approval? Thanks!

## 2022-06-09 NOTE — Telephone Encounter (Signed)
PA submitted thru covermymeds for Prolia. Awaiting response.

## 2022-06-11 NOTE — Telephone Encounter (Signed)
Per [employer], no PA is required for this medication.

## 2022-06-16 ENCOUNTER — Telehealth: Payer: Self-pay | Admitting: Physician Assistant

## 2022-06-16 MED ORDER — POLYMYXIN B-TRIMETHOPRIM 10000-0.1 UNIT/ML-% OP SOLN: 1.0000 [drp] | OPHTHALMIC | 0 refills | Status: DC

## 2022-06-16 NOTE — Telephone Encounter (Signed)
Pt has URI symptoms with bilateral itchy watery eyes. Treated with OTC symptoms and polytrim.

## 2022-07-02 LAB — COMPLETE METABOLIC PANEL WITH GFR
AG Ratio: 1.6 (calc) (ref 1.0–2.5)
ALT: 15 U/L (ref 6–29)
AST: 18 U/L (ref 10–35)
Albumin: 4.2 g/dL (ref 3.6–5.1)
Alkaline phosphatase (APISO): 74 U/L (ref 37–153)
BUN: 7 mg/dL (ref 7–25)
CO2: 26 mmol/L (ref 20–32)
Calcium: 9.8 mg/dL (ref 8.6–10.4)
Chloride: 103 mmol/L (ref 98–110)
Creat: 0.66 mg/dL (ref 0.60–1.00)
Globulin: 2.6 g/dL (calc) (ref 1.9–3.7)
Glucose, Bld: 89 mg/dL (ref 65–99)
Potassium: 3.3 mmol/L — ABNORMAL LOW (ref 3.5–5.3)
Sodium: 140 mmol/L (ref 135–146)
Total Bilirubin: 0.5 mg/dL (ref 0.2–1.2)
Total Protein: 6.8 g/dL (ref 6.1–8.1)
eGFR: 92 mL/min/{1.73_m2} (ref >=60)

## 2022-07-02 LAB — LIPID PANEL W/REFLEX DIRECT LDL
Cholesterol: 164 mg/dL (ref <200)
HDL: 65 mg/dL (ref >=50)
LDL Cholesterol (Calc): 80 mg/dL (calc)
Non-HDL Cholesterol (Calc): 99 mg/dL (calc) (ref <130)
Total CHOL/HDL Ratio: 2.5 (calc) (ref <5.0)
Triglycerides: 103 mg/dL (ref <150)

## 2022-07-02 NOTE — Progress Notes (Signed)
Cholesterol looks great.  Potassium still a little low. How much are you taking?  Kidney and liver look good.   Regina Walters.The 10-year ASCVD risk score (Arnett DK, et al., 2019) is: 13.5%   Values used to calculate the score:     Age: 76 years     Sex: Female     Is Non-Hispanic African American: No     Diabetic: No     Tobacco smoker: No     Systolic Blood Pressure: 125 mmHg     Is BP treated: No     HDL Cholesterol: 65 mg/dL     Total Cholesterol: 164 mg/dL

## 2022-07-03 ENCOUNTER — Other Ambulatory Visit: Payer: Self-pay | Admitting: Neurology

## 2022-07-03 MED ORDER — POTASSIUM CHLORIDE ER 10 MEQ PO CPCR: 20.0000 meq | ORAL_CAPSULE | Freq: Every day | ORAL | 1 refills | Status: DC

## 2022-07-09 ENCOUNTER — Ambulatory Visit (INDEPENDENT_AMBULATORY_CARE_PROVIDER_SITE_OTHER): Payer: PPO | Admitting: Family Medicine

## 2022-07-09 VITALS — BP 129/72 | HR 92 | Ht <= 58 in | Wt 137.0 lb

## 2022-07-09 DIAGNOSIS — M810 Age-related osteoporosis without current pathological fracture: Secondary | ICD-10-CM

## 2022-07-09 MED ORDER — DENOSUMAB 60 MG/ML ~~LOC~~ SOSY
60.0000 mg | PREFILLED_SYRINGE | Freq: Once | SUBCUTANEOUS | Status: AC
  Administered 2022-07-09: 60 mg via SUBCUTANEOUS

## 2022-07-09 NOTE — Progress Notes (Signed)
   Subjective:    Patient ID: Regina Walters, female    DOB: [birth year], 77 y.o.   MRN: [id number]  HPI Pt here for Prolia injection, pt reports taking calcium and vitamin d daily. Pts calcium levels and kidney function was within normal limits.    Review of Systems     Objective:   Physical Exam        Assessment & Plan:  Pt tolerated injection well without complications. Pt advised to schedule next injection in 6 months.

## 2022-07-10 NOTE — Progress Notes (Signed)
Agree with documentation as above.   Regina Eschmann, MD  

## 2022-07-15 ENCOUNTER — Telehealth: Payer: Self-pay | Admitting: Neurology

## 2022-07-15 DIAGNOSIS — M810 Age-related osteoporosis without current pathological fracture: Secondary | ICD-10-CM

## 2022-07-15 NOTE — Telephone Encounter (Signed)
Patient concerned Prolia not working, has lost another 1/2 inch in height. She is due for bone density, last done in June 2021. She is okay with repeating testing. Bone Density ordered.   Brevig [place].

## 2022-07-15 NOTE — Telephone Encounter (Signed)
Agree with plan 

## 2022-08-05 ENCOUNTER — Ambulatory Visit (INDEPENDENT_AMBULATORY_CARE_PROVIDER_SITE_OTHER): Payer: PPO

## 2022-08-05 DIAGNOSIS — M810 Age-related osteoporosis without current pathological fracture: Secondary | ICD-10-CM | POA: Diagnosis not present

## 2022-08-05 NOTE — Progress Notes (Signed)
Bone density worsened from -3.4 to -.4.0. not what we wanted to see but hopefully prolia is decreasing the rate at which density is falling. Recheck in 2 years.

## 2022-08-22 ENCOUNTER — Other Ambulatory Visit: Payer: Self-pay | Admitting: Physician Assistant

## 2022-09-21 ENCOUNTER — Telehealth: Payer: Self-pay | Admitting: Physician Assistant

## 2022-09-21 NOTE — Telephone Encounter (Signed)
Called patient to schedule Medicare Annual Wellness Visit (AWV). Left message for patient to call back and schedule Medicare Annual Wellness Visit (AWV).  Last date of AWV: Never  Please schedule an appointment at any time with [facility].  If any questions, please contact me at [phone].  Thank you ,  Lin Givens Patient Access Advocate II Direct Dial: [phone]

## 2022-11-04 ENCOUNTER — Other Ambulatory Visit: Payer: Self-pay

## 2022-11-04 DIAGNOSIS — M810 Age-related osteoporosis without current pathological fracture: Secondary | ICD-10-CM

## 2022-11-04 NOTE — Progress Notes (Signed)
Ordered labs for Prolia injection.  

## 2022-11-17 ENCOUNTER — Other Ambulatory Visit: Payer: Self-pay

## 2022-11-17 MED ORDER — POTASSIUM CHLORIDE ER 10 MEQ PO CPCR: 20.0000 meq | ORAL_CAPSULE | Freq: Every day | ORAL | 1 refills | Status: DC

## 2022-11-20 ENCOUNTER — Other Ambulatory Visit: Payer: Self-pay | Admitting: Physician Assistant

## 2022-12-09 ENCOUNTER — Telehealth: Payer: Self-pay

## 2022-12-09 NOTE — Telephone Encounter (Signed)
Please check if a PA is required for Prolia.

## 2022-12-10 ENCOUNTER — Ambulatory Visit (INDEPENDENT_AMBULATORY_CARE_PROVIDER_SITE_OTHER): Payer: PPO | Admitting: Family Medicine

## 2022-12-10 VITALS — BP 145/78 | HR 89 | Ht <= 58 in | Wt 139.5 lb

## 2022-12-10 DIAGNOSIS — M810 Age-related osteoporosis without current pathological fracture: Secondary | ICD-10-CM | POA: Diagnosis not present

## 2022-12-10 LAB — COMPLETE METABOLIC PANEL WITH GFR
AG Ratio: 1.6 (calc) (ref 1.0–2.5)
ALT: 17 U/L (ref 6–29)
AST: 19 U/L (ref 10–35)
Albumin: 4.1 g/dL (ref 3.6–5.1)
Alkaline phosphatase (APISO): 65 U/L (ref 37–153)
BUN: 10 mg/dL (ref 7–25)
CO2: 24 mmol/L (ref 20–32)
Calcium: 9.5 mg/dL (ref 8.6–10.4)
Chloride: 105 mmol/L (ref 98–110)
Creat: 0.65 mg/dL (ref 0.60–1.00)
Globulin: 2.5 g/dL (calc) (ref 1.9–3.7)
Glucose, Bld: 104 mg/dL — ABNORMAL HIGH (ref 65–99)
Potassium: 4.1 mmol/L (ref 3.5–5.3)
Sodium: 142 mmol/L (ref 135–146)
Total Bilirubin: 0.4 mg/dL (ref 0.2–1.2)
Total Protein: 6.6 g/dL (ref 6.1–8.1)
eGFR: 92 mL/min/{1.73_m2} (ref >=60)

## 2022-12-10 MED ORDER — DENOSUMAB 60 MG/ML ~~LOC~~ SOSY
60.0000 mg | PREFILLED_SYRINGE | Freq: Once | SUBCUTANEOUS | Status: AC
  Administered 2022-12-10: 60 mg via SUBCUTANEOUS

## 2022-12-10 NOTE — Progress Notes (Signed)
Blood pressure is elevated and her home blood pressures are all elevated except for 1.  She needs to consider a low-dose blood pressure pill but certainly she can speak with her PCP about this.

## 2022-12-10 NOTE — Progress Notes (Signed)
   Established Patient Office Visit  Subjective   Patient ID: Regina Walters, female    DOB: 1946-01-02  Age: 77 y.o. MRN: [id number]  Chief Complaint  Patient presents with   Osteoporosis    Prolia injection - nurse visit.     HPI  Osteoporosis- Prolia injection- patient confirms that she is taking daily Vit D and calcium. Patient lab work in acceptable range for injection. Patient does have large bruise posterior upper right arm injeciton will be administered above this bruise. Patient brought in home BP readings : 141/64, 143/74, 137/64, 128/71, 136/67, 154/67, 139/69, 131/64, 134/71. Dr. Linford Arnold had recommended starting a BP Medication but patient declined. She states she will wait and speak with Tandy Gaw , PA at her next visit.   ROS    Objective:     BP (!) 145/78   Pulse 89   Ht 4\' 10"  (1.473 m)   Wt 139 lb 8 oz (63.3 kg)   SpO2 100%   BMI 29.16 kg/m    Physical Exam   No results found for any visits on 12/10/22.    The 10-year ASCVD risk score (Arnett DK, et al., 2019) is: 19.7%    Assessment & Plan:  Prolia injection - nurse visit.  [name], CMA administered injection Right upper arm. Patient tolerated injection well without complications.  Problem List Items Addressed This Visit   None   No follow-ups on file.    Elizabeth Palau, LPN

## 2022-12-10 NOTE — Telephone Encounter (Signed)
No PA required.   Ref# Roseanna Rainbow 12/10/2022

## 2022-12-10 NOTE — Patient Instructions (Signed)
Return in 6 months and 1 day for next prolia injection - nurse visit. Lab work is required at least one week before  next injection given.

## 2022-12-11 NOTE — Progress Notes (Signed)
Potassium looks GREAT.

## 2023-05-07 ENCOUNTER — Telehealth: Payer: PPO | Admitting: Physician Assistant

## 2023-05-07 ENCOUNTER — Telehealth: Payer: Self-pay | Admitting: Physician Assistant

## 2023-05-07 MED ORDER — TRAZODONE HCL 50 MG PO TABS: 25.0000 mg | ORAL_TABLET | Freq: Every evening | ORAL | 0 refills | Status: DC | PRN

## 2023-05-07 NOTE — Telephone Encounter (Signed)
Patient stated she does have trouble sleeping and she needs something really strong and I made her a return appointment in 2 weeks    Pharmacy [facility] [place]  Phone: [phone]

## 2023-05-07 NOTE — Telephone Encounter (Signed)
Sent trazodone to take 30 minutes before bedtime.

## 2023-05-17 ENCOUNTER — Other Ambulatory Visit: Payer: Self-pay | Admitting: Physician Assistant

## 2023-05-21 ENCOUNTER — Ambulatory Visit (INDEPENDENT_AMBULATORY_CARE_PROVIDER_SITE_OTHER): Payer: PPO | Admitting: Physician Assistant

## 2023-05-21 ENCOUNTER — Encounter: Payer: Self-pay | Admitting: Physician Assistant

## 2023-05-21 VITALS — BP 172/78 | HR 87 | Ht <= 58 in | Wt 136.2 lb

## 2023-05-21 DIAGNOSIS — F5101 Primary insomnia: Secondary | ICD-10-CM | POA: Diagnosis not present

## 2023-05-21 DIAGNOSIS — R030 Elevated blood-pressure reading, without diagnosis of hypertension: Secondary | ICD-10-CM | POA: Insufficient documentation

## 2023-05-21 MED ORDER — ZOLPIDEM TARTRATE 5 MG PO TABS: 5.0000 mg | ORAL_TABLET | Freq: Every evening | ORAL | 1 refills | Status: DC | PRN

## 2023-05-21 NOTE — Progress Notes (Signed)
Established Patient Office Visit  Subjective   Patient ID: Regina Walters, female    DOB: [birth year]  Age: 77 y.o. MRN: [id number]  Chief Complaint  Patient presents with   Medical Management of Chronic Issues    2 week fup on insomnia, pt prescribed trazodone     HPI Pt is a 77 yo female who presents to the clinic to discuss sleep. She has really not been sleeping for "years" but recently go to where she could not take it anymore. She has tried OTC sleep medications and hemp "helps sometimes". I sent her trazodone to try before this appt and stated it did not help at all. She went to sleep last night at 9:30 and up at 10:30 and been up ever since.   .. Active Ambulatory Problems    Diagnosis Date Noted   Acute on chronic cholecystitis s/p lap cholecystectomy 04/21/2019 04/10/2019   Gastroesophageal reflux disease 04/10/2019   Liver cyst 04/09/2013   Displaced fracture of proximal end of left humerus 06/09/2019   Elevated blood pressure reading without diagnosis of hypertension 10/11/2019   Right carotid bruit 10/11/2019   Pulsatile neck mass 10/13/2019   Systolic murmur 10/13/2019   Mixed hyperlipidemia 10/13/2019   Anxiety 05/20/2020   Primary insomnia 05/20/2020   Osteoporosis 01/03/2021   Fractured hand, left, closed, initial encounter 05/01/2021   Memory changes 07/09/2021   Hypokalemia 07/09/2021   White coat syndrome without diagnosis of hypertension 05/21/2023   Resolved Ambulatory Problems    Diagnosis Date Noted   No Resolved Ambulatory Problems   Past Medical History:  Diagnosis Date   History of kidney stones    Kidney stone on left side 2014     ROS See HPI.    Objective:     BP (!) 172/78   Pulse 87   Ht 4\' 10"  (1.473 m)   Wt 136 lb 4 oz (61.8 kg)   SpO2 98%   BMI 28.48 kg/m  BP Readings from Last 3 Encounters:  05/21/23 (!) 172/78  12/10/22 (!) 145/78  07/09/22 129/72   Wt Readings from Last 3 Encounters:  05/21/23 136 lb 4 oz  (61.8 kg)  12/10/22 139 lb 8 oz (63.3 kg)  07/09/22 137 lb (62.1 kg)      Physical Exam Constitutional:      Appearance: Normal appearance.  HENT:     Head: Normocephalic.  Cardiovascular:     Rate and Rhythm: Normal rate and regular rhythm.     Pulses: Normal pulses.  Pulmonary:     Effort: Pulmonary effort is normal.     Breath sounds: Normal breath sounds.  Neurological:     General: No focal deficit present.     Mental Status: She is alert and oriented to person, place, and time.  Psychiatric:        Mood and Affect: Mood normal.        The 10-year ASCVD risk score (Arnett DK, et al., 2019) is: 26.4%    Assessment & Plan:  Marland KitchenMarland KitchenLashieka "Regina Walters" was seen today for medical management of chronic issues.  Diagnoses and all orders for this visit:  White coat syndrome without diagnosis of hypertension  Primary insomnia -     zolpidem (AMBIEN) 5 MG tablet; Take 1 tablet (5 mg total) by mouth at bedtime as needed for sleep.   Failed trazodone Trial of ambien-discussed SE Encouraged good sleep routine  Follow up in 1 month Let me know sooner if not having any  progess or worsening symptoms  BP's at home are under 140/90.  White coat HTN.   Return in about 4 weeks (around 06/18/2023).    Tandy Gaw, PA-C

## 2023-05-21 NOTE — Patient Instructions (Signed)
Insomnia Insomnia is a sleep disorder that makes it difficult to fall asleep or stay asleep. Insomnia can cause fatigue, low energy, difficulty concentrating, mood swings, and poor performance at work or school. There are three different ways to classify insomnia: Difficulty falling asleep. Difficulty staying asleep. Waking up too early in the morning. Any type of insomnia can be long-term (chronic) or short-term (acute). Both are common. Short-term insomnia usually lasts for 3 months or less. Chronic insomnia occurs at least three times a week for longer than 3 months. What are the causes? Insomnia may be caused by another condition, situation, or substance, such as: Having certain mental health conditions, such as anxiety and depression. Using caffeine, alcohol, tobacco, or drugs. Having gastrointestinal conditions, such as gastroesophageal reflux disease (GERD). Having certain medical conditions. These include: Asthma. Alzheimer's disease. Stroke. Chronic pain. An overactive thyroid gland (hyperthyroidism). Other sleep disorders, such as restless legs syndrome and sleep apnea. Menopause. Sometimes, the cause of insomnia may not be known. What increases the risk? Risk factors for insomnia include: Gender. Females are affected more often than males. Age. Insomnia is more common as people get older. Stress and certain medical and mental health conditions. Lack of exercise. Having an irregular work schedule. This may include working night shifts and traveling between different time zones. What are the signs or symptoms? If you have insomnia, the main symptom is having trouble falling asleep or having trouble staying asleep. This may lead to other symptoms, such as: Feeling tired or having low energy. Feeling nervous about going to sleep. Not feeling rested in the morning. Having trouble concentrating. Feeling irritable, anxious, or depressed. How is this diagnosed? This condition  may be diagnosed based on: Your symptoms and medical history. Your health care provider may ask about: Your sleep habits. Any medical conditions you have. Your mental health. A physical exam. How is this treated? Treatment for insomnia depends on the cause. Treatment may focus on treating an underlying condition that is causing the insomnia. Treatment may also include: Medicines to help you sleep. Counseling or therapy. Lifestyle adjustments to help you sleep better. Follow these instructions at home: Eating and drinking  Limit or avoid alcohol, caffeinated beverages, and products that contain nicotine and tobacco, especially close to bedtime. These can disrupt your sleep. Do not eat a large meal or eat spicy foods right before bedtime. This can lead to digestive discomfort that can make it hard for you to sleep. Sleep habits  Keep a sleep diary to help you and your health care provider figure out what could be causing your insomnia. Write down: When you sleep. When you wake up during the night. How well you sleep and how rested you feel the next day. Any side effects of medicines you are taking. What you eat and drink. Make your bedroom a dark, comfortable place where it is easy to fall asleep. Put up shades or blackout curtains to block light from outside. Use a white noise machine to block noise. Keep the temperature cool. Limit screen use before bedtime. This includes: Not watching TV. Not using your smartphone, tablet, or computer. Stick to a routine that includes going to bed and waking up at the same times every day and night. This can help you fall asleep faster. Consider making a quiet activity, such as reading, part of your nighttime routine. Try to avoid taking naps during the day so that you sleep better at night. Get out of bed if you are still awake after   15 minutes of trying to sleep. Keep the lights down, but try reading or doing a quiet activity. When you feel  sleepy, go back to bed. General instructions Take over-the-counter and prescription medicines only as told by your health care provider. Exercise regularly as told by your health care provider. However, avoid exercising in the hours right before bedtime. Use relaxation techniques to manage stress. Ask your health care provider to suggest some techniques that may work well for you. These may include: Breathing exercises. Routines to release muscle tension. Visualizing peaceful scenes. Make sure that you drive carefully. Do not drive if you feel very sleepy. Keep all follow-up visits. This is important. Contact a health care provider if: You are tired throughout the day. You have trouble in your daily routine due to sleepiness. You continue to have sleep problems, or your sleep problems get worse. Get help right away if: You have thoughts about hurting yourself or someone else. Get help right away if you feel like you may hurt yourself or others, or have thoughts about taking your own life. Go to your nearest emergency room or: Call 911. Call the National Suicide Prevention Lifeline at 1-800-273-8255 or 988. This is open 24 hours a day. Text the Crisis Text Line at 741741. Summary Insomnia is a sleep disorder that makes it difficult to fall asleep or stay asleep. Insomnia can be long-term (chronic) or short-term (acute). Treatment for insomnia depends on the cause. Treatment may focus on treating an underlying condition that is causing the insomnia. Keep a sleep diary to help you and your health care provider figure out what could be causing your insomnia. This information is not intended to replace advice given to you by your health care provider. Make sure you discuss any questions you have with your health care provider. Document Revised: 06/02/2021 Document Reviewed: 06/02/2021 Elsevier Patient Education  2024 [employer].  

## 2023-05-24 ENCOUNTER — Telehealth: Payer: Self-pay | Admitting: Physician Assistant

## 2023-05-24 MED ORDER — AMITRIPTYLINE HCL 25 MG PO TABS: 25.0000 mg | ORAL_TABLET | Freq: Every day | ORAL | 0 refills | Status: DC

## 2023-05-24 NOTE — Telephone Encounter (Signed)
Patient called she is wanting to change sleep medication AMBIEN 5mg  she says it isn't working for her please advise   [facility] in [place] [phone]

## 2023-05-28 ENCOUNTER — Ambulatory Visit: Payer: PPO | Admitting: Family Medicine

## 2023-05-28 ENCOUNTER — Ambulatory Visit: Payer: Self-pay | Admitting: Physician Assistant

## 2023-05-28 ENCOUNTER — Ambulatory Visit (INDEPENDENT_AMBULATORY_CARE_PROVIDER_SITE_OTHER): Payer: PPO | Admitting: Physician Assistant

## 2023-05-28 ENCOUNTER — Encounter: Payer: Self-pay | Admitting: Physician Assistant

## 2023-05-28 VITALS — BP 174/89 | HR 111 | Ht <= 58 in | Wt 136.0 lb

## 2023-05-28 DIAGNOSIS — R000 Tachycardia, unspecified: Secondary | ICD-10-CM | POA: Diagnosis not present

## 2023-05-28 DIAGNOSIS — R55 Syncope and collapse: Secondary | ICD-10-CM

## 2023-05-28 DIAGNOSIS — R2689 Other abnormalities of gait and mobility: Secondary | ICD-10-CM | POA: Diagnosis not present

## 2023-05-28 DIAGNOSIS — R030 Elevated blood-pressure reading, without diagnosis of hypertension: Secondary | ICD-10-CM | POA: Diagnosis not present

## 2023-05-28 DIAGNOSIS — F5101 Primary insomnia: Secondary | ICD-10-CM

## 2023-05-28 DIAGNOSIS — R296 Repeated falls: Secondary | ICD-10-CM | POA: Diagnosis not present

## 2023-05-28 DIAGNOSIS — T148XXA Other injury of unspecified body region, initial encounter: Secondary | ICD-10-CM

## 2023-05-28 DIAGNOSIS — R42 Dizziness and giddiness: Secondary | ICD-10-CM

## 2023-05-28 NOTE — Patient Instructions (Signed)
Will get labs to evaluate bruising

## 2023-05-28 NOTE — Telephone Encounter (Addendum)
Chief Complaint: lightheaded and eyelid swelling Symptoms: "puffy" eyelids and under eyes, lightheaded Frequency: Pt states dizziness/lightheaded ongoing since October, woke up this morning with puffy eyes Pertinent Negatives: Patient denies any recent falls, visual changes, changes in balance, weakness or numbness (unilateral or bilateral), changes in speech, chest pain, SOB, discharge from eyes, blood in sclera, fever, eye itchiness Disposition: [] ED /[] Urgent Care (no appt availability in office) / [x] Appointment(In office/virtual)/ []  [facility]/ [] Home Care/ [] Refused Recommended Disposition /[] [facility]/ []  Follow-up with PCP Additional Notes: Pt states she had bilateral cataracts surgery October 7th and has been experiencing lightheaded/dizziness since then. States the lightheaded feeling today feels the same as the past month. Pt states she woke up this morning and noticed her eyes looked puffy. Pt also reports starting a new sleeping medication 3-4 days ago, she thinks symptoms are related to the med.    Copied from CRM [id number]. Topic: Clinical - Red Word Triage >> May 28, 2023  8:10 AM Regina Walters wrote: Red Word that prompted transfer to Nurse Triage: Mentioning blood near eyes and near nose but not sure how it got there. She feels "weird" and dizzy. She is on sleep medication, amitriptyline (ELAVIL) 25 MG tablet. She stated she needs to see the doctor. [phone] Reason for Disposition  [1] MODERATE dizziness (e.g., interferes with normal activities) AND [2] has NOT been evaluated by doctor (or NP/PA) for this  (Exception: Dizziness caused by heat exposure, sudden standing, or poor fluid intake.)  Eyelid swelling from suspected mild irritant  Answer Assessment - Initial Assessment Questions 1. DESCRIPTION: "Describe your dizziness."     "I feel lightheaded and my eyes feel funny"; denies any visual changes, no blurred vision "I can see the TV just fine" 2.  LIGHTHEADED: "Do you feel lightheaded?" (e.g., somewhat faint, woozy, weak upon standing)     "I feel weird is all I can describe it as" 3. VERTIGO: "Do you feel like either you or the room is spinning or tilting?" (i.e. vertigo)     No. 4. SEVERITY: "How bad is it?"  "Do you feel like you are going to faint?" "Can you stand and walk?"   - MILD: Feels slightly dizzy, but walking normally.   - MODERATE: Feels unsteady when walking, but not falling; interferes with normal activities (e.g., school, work).   - SEVERE: Unable to walk without falling, or requires assistance to walk without falling; feels like passing out now.       "I can walk and stuff" denies any falling or changes in balance 5. ONSET:  "When did the dizziness begin?"     Started after October 7th cataract surgery; woke up and states the dizziness feels the same.  6. AGGRAVATING FACTORS: "Does anything make it worse?" (e.g., standing, change in head position)     "I'm sitting now and it worse when I stand up"  7. HEART RATE: "Can you tell me your heart rate?" "How many beats in 15 seconds?"  (Note: not all patients can do this)       Pt states HR feels normal, denies chest pain.  8. CAUSE: "What do you think is causing the dizziness?"     Pt states "that sleeping pill is new" states she started a sleeping medication 3-4 days ago.  9. RECURRENT SYMPTOM: "Have you had dizziness before?" If Yes, ask: "When was the last time?" "What happened that time?"     Yes; Pt states the dizziness has been constant  since October 7th "some days are better and I feel like I'm getting over it and some days I don't"  10. OTHER SYMPTOMS: "Do you have any other symptoms?" (e.g., fever, chest pain, vomiting, diarrhea, bleeding)       Eyelid swelling this morning worse than usual; "they're always puffy" "I just noticed it when I was washing my face"  Answer Assessment - Initial Assessment Questions 1. ONSET: "When did the swelling start?" (e.g.,  minutes, hours, days)     Noticed at 7am this morning when she woke up  2. LOCATION: "What part of the eyelids is swollen?"     Eyelids and under eyes  3. SEVERITY: "How swollen is it?"     Not swollen shut, puffier than normal. Pt states she can see out of them and open and close like normal, only noticed the puffiness "when I looked in the mirror"  4. ITCHING: "Is there any itching?" If Yes, ask: "How much?"   (Scale 1-10; mild, moderate or severe)     No   5. PAIN: "Is the swelling painful to touch?" If Yes, ask: "How painful is it?"   (Scale 1-10; mild, moderate or severe)     No   6. FEVER: "Do you have a fever?" If Yes, ask: "What is it, how was it measured, and when did it start?"      No  7. CAUSE: "What do you think is causing the swelling?"     Unsure  8. RECURRENT SYMPTOM: "Have you had eyelid swelling before?" If Yes, ask: "When was the last time?" "What happened that time?"     No; this is new.  9. OTHER SYMPTOMS: "Do you have any other symptoms?" (e.g., blurred vision, eye discharge, rash, runny nose)     Denies any other symptoms related to eyes.  Protocols used: Dizziness - Lightheadedness-A-AH, Eye - Swelling-A-AH

## 2023-05-28 NOTE — Progress Notes (Unsigned)
Acute Office Visit  Subjective:     Patient ID: Regina Walters, female    DOB: 06-04-1946, 77 y.o.   MRN: [id number]  Chief Complaint  Patient presents with   Dizziness    Bruising  under both eyes, dizziness    Patient is in today for bilateral under-eye and forehead bruising that she noticed when she woke up this morning. She denies injury, fall, pain/soreness, sleep walking, vision change, other bruising, and confusion. She fell last week and said her forehead hurt but did not notice bruising afterwards. She wears glasses.  She also reports dizziness that started after her bilateral cataract surgery 04/12/23. She states it makes her "walk funny" but she does not feel like the room is spinning.   She also reports that Ambien did not help her sleep, but amitriptyline is helping. She denies any worsening dizziness after starting amitriptyline.     .. Active Ambulatory Problems    Diagnosis Date Noted   Acute on chronic cholecystitis s/p lap cholecystectomy 04/21/2019 04/10/2019   Gastroesophageal reflux disease 04/10/2019   Liver cyst 04/09/2013   Displaced fracture of proximal end of left humerus 06/09/2019   Elevated blood pressure reading without diagnosis of hypertension 10/11/2019   Right carotid bruit 10/11/2019   Pulsatile neck mass 10/13/2019   Systolic murmur 10/13/2019   Mixed hyperlipidemia 10/13/2019   Anxiety 05/20/2020   Primary insomnia 05/20/2020   Osteoporosis 01/03/2021   Fractured hand, left, closed, initial encounter 05/01/2021   Memory changes 07/09/2021   Hypokalemia 07/09/2021   White coat syndrome without diagnosis of hypertension 05/21/2023   Balance problem 06/01/2023   Frequent falls 06/01/2023   Bruising 06/01/2023   Tachycardia 06/01/2023   Resolved Ambulatory Problems    Diagnosis Date Noted   No Resolved Ambulatory Problems   Past Medical History:  Diagnosis Date   History of kidney stones    Kidney stone on left side 2014        Review of Systems  Eyes:  Negative for blurred vision, double vision and redness.  Neurological:  Positive for dizziness.        Objective:    BP (!) 174/89   Pulse (!) 111   Ht 4\' 10"  (1.473 m)   Wt 136 lb (61.7 kg)   SpO2 99%   BMI 28.42 kg/m  BP Readings from Last 3 Encounters:  05/28/23 (!) 174/89  05/21/23 (!) 172/78  12/10/22 (!) 145/78   Wt Readings from Last 3 Encounters:  05/28/23 136 lb (61.7 kg)  05/21/23 136 lb 4 oz (61.8 kg)  12/10/22 139 lb 8 oz (63.3 kg)      Physical Exam Constitutional:      Appearance: She is normal weight.  HENT:     Head:      Comments: Erythematous/violaceous bruising present under both eyes (purple circles). Greenish bruise on right side of forehead (green circle). No TTP. No subconjunctival hemorrhage noted. Skin:    Comments: Violet bruise on right 3rd digit on distal dorsal surface  Neurological:     Mental Status: She is alert.         Assessment & Plan:  Marland KitchenMarland KitchenLen "Debbie" was seen today for dizziness.  Diagnoses and all orders for this visit:  Bruising -     CMP14+EGFR -     CBC w/Diff/Platelet -     Fe+TIBC+Fer  White coat syndrome without diagnosis of hypertension  Tachycardia  Frequent falls -     MR Brain W Wo  Contrast; Future  Balance problem -     MR Brain W Wo Contrast; Future  Primary insomnia  Postural dizziness with near syncope -     MR Brain W Wo Contrast; Future   Etiology for bruising unknown at this time but likely due to falling.  Family member reached out and stated patient has been falling "a lot".  Will get MRI "Dizziness" does not sound like vertigo near syncope vs balance problems Orthostatic vitals have been good Vitals elevated in office but patient reports good BP at home CBC, iron panel, and CMP ordered. Recommended icing the areas of swelling and bruising.  Pt feels like amitriptyline is working for sleep and she reports no worsening of falling or dizziness since  starting. Will monitor this closely.  Follow up in 4 weeks.     Return if symptoms worsen or fail to improve.  Tandy Gaw, PA-C

## 2023-05-29 LAB — CBC WITH DIFFERENTIAL/PLATELET
Basophils Absolute: 0.1 10*3/uL (ref 0.0–0.2)
Basos: 1 %
EOS (ABSOLUTE): 0.1 10*3/uL (ref 0.0–0.4)
Eos: 1 %
Hematocrit: 45.8 % (ref 34.0–46.6)
Hemoglobin: 15.2 g/dL (ref 11.1–15.9)
Immature Grans (Abs): 0 10*3/uL (ref 0.0–0.1)
Immature Granulocytes: 0 %
Lymphocytes Absolute: 1.2 10*3/uL (ref 0.7–3.1)
Lymphs: 17 %
MCH: 28.9 pg (ref 26.6–33.0)
MCHC: 33.2 g/dL (ref 31.5–35.7)
MCV: 87 fL (ref 79–97)
Monocytes Absolute: 0.4 10*3/uL (ref 0.1–0.9)
Monocytes: 6 %
Neutrophils Absolute: 5.4 10*3/uL (ref 1.4–7.0)
Neutrophils: 75 %
Platelets: 256 10*3/uL (ref 150–450)
RBC: 5.26 x10E6/uL (ref 3.77–5.28)
RDW: 12.5 % (ref 11.7–15.4)
WBC: 7.1 10*3/uL (ref 3.4–10.8)

## 2023-05-29 LAB — IRON,TIBC AND FERRITIN PANEL
Ferritin: 55 ng/mL (ref 15–150)
Iron Saturation: 15 % (ref 15–55)
Iron: 54 ug/dL (ref 27–139)
Total Iron Binding Capacity: 354 ug/dL (ref 250–450)
UIBC: 300 ug/dL (ref 118–369)

## 2023-05-29 LAB — CMP14+EGFR
ALT: 20 [IU]/L (ref 0–32)
AST: 27 [IU]/L (ref 0–40)
Albumin: 4.6 g/dL (ref 3.8–4.8)
Alkaline Phosphatase: 85 [IU]/L (ref 44–121)
BUN/Creatinine Ratio: 9 — ABNORMAL LOW (ref 12–28)
BUN: 6 mg/dL — ABNORMAL LOW (ref 8–27)
Bilirubin Total: 0.5 mg/dL (ref 0.0–1.2)
CO2: 22 mmol/L (ref 20–29)
Calcium: 9.8 mg/dL (ref 8.7–10.3)
Chloride: 102 mmol/L (ref 96–106)
Creatinine, Ser: 0.69 mg/dL (ref 0.57–1.00)
Globulin, Total: 2.7 g/dL (ref 1.5–4.5)
Glucose: 107 mg/dL — ABNORMAL HIGH (ref 70–99)
Potassium: 4.1 mmol/L (ref 3.5–5.2)
Sodium: 138 mmol/L (ref 134–144)
Total Protein: 7.3 g/dL (ref 6.0–8.5)
eGFR: 90 mL/min/{1.73_m2} (ref >59)

## 2023-05-31 NOTE — Progress Notes (Signed)
Labs look good overall. I am concerned about you falling and amitriptyline making your falling worse. Do you feel like it has helped? Do you feel like you are more groggy in morning or increased fall risk?   I want you to have formal PT for balance. Are you ok with this?

## 2023-06-01 ENCOUNTER — Encounter: Payer: Self-pay | Admitting: Physician Assistant

## 2023-06-01 DIAGNOSIS — R2689 Other abnormalities of gait and mobility: Secondary | ICD-10-CM | POA: Insufficient documentation

## 2023-06-01 DIAGNOSIS — R000 Tachycardia, unspecified: Secondary | ICD-10-CM | POA: Insufficient documentation

## 2023-06-01 DIAGNOSIS — R296 Repeated falls: Secondary | ICD-10-CM | POA: Insufficient documentation

## 2023-06-01 DIAGNOSIS — T148XXA Other injury of unspecified body region, initial encounter: Secondary | ICD-10-CM | POA: Insufficient documentation

## 2023-06-03 ENCOUNTER — Other Ambulatory Visit: Payer: Self-pay | Admitting: Physician Assistant

## 2023-06-14 ENCOUNTER — Ambulatory Visit: Payer: PPO

## 2023-06-18 ENCOUNTER — Telehealth: Payer: Self-pay

## 2023-06-18 ENCOUNTER — Ambulatory Visit: Payer: PPO | Admitting: Physician Assistant

## 2023-06-18 VITALS — BP 163/77 | HR 111 | Wt 135.0 lb

## 2023-06-18 DIAGNOSIS — M8000XA Age-related osteoporosis with current pathological fracture, unspecified site, initial encounter for fracture: Secondary | ICD-10-CM

## 2023-06-18 DIAGNOSIS — R413 Other amnesia: Secondary | ICD-10-CM

## 2023-06-18 DIAGNOSIS — R296 Repeated falls: Secondary | ICD-10-CM | POA: Diagnosis not present

## 2023-06-18 DIAGNOSIS — R2689 Other abnormalities of gait and mobility: Secondary | ICD-10-CM

## 2023-06-18 DIAGNOSIS — F5101 Primary insomnia: Secondary | ICD-10-CM

## 2023-06-18 DIAGNOSIS — F0390 Unspecified dementia without behavioral disturbance: Secondary | ICD-10-CM | POA: Diagnosis not present

## 2023-06-18 MED ORDER — DENOSUMAB 60 MG/ML ~~LOC~~ SOSY: 60.0000 mg | PREFILLED_SYRINGE | Freq: Once | SUBCUTANEOUS | Status: DC

## 2023-06-18 MED ORDER — AMITRIPTYLINE HCL 25 MG PO TABS: 25.0000 mg | ORAL_TABLET | Freq: Every day | ORAL | 0 refills | Status: DC

## 2023-06-18 MED ORDER — DONEPEZIL HCL 5 MG PO TBDP: 5.0000 mg | ORAL_TABLET | Freq: Every day | ORAL | 0 refills | Status: DC

## 2023-06-18 MED ORDER — DENOSUMAB 60 MG/ML ~~LOC~~ SOSY
60.0000 mg | PREFILLED_SYRINGE | Freq: Once | SUBCUTANEOUS | Status: AC
  Administered 2023-06-18: 60 mg via SUBCUTANEOUS

## 2023-06-18 NOTE — Telephone Encounter (Signed)
Copied from CRM [id number]. Topic: Clinical - Medication Question >> Jun 18, 2023  1:17 PM Almira Coaster wrote: Reason for CRM: Patient was seen in the office today with Lesly Rubenstein who patient states recommended starting memory medication. Patient has reconsidered and would like to start the medication but does not know the name. She would like to speak with Lesly Rubenstein if possible or if the prescription can be sent to the pharmacy.

## 2023-06-18 NOTE — Patient Instructions (Signed)
Consider memory medication or referral MRI would be needed Keep BP at home under 130/80 Wear compression stockings  Orthostatic Hypotension Blood pressure is a measurement of how strongly, or weakly, your circulating blood is pressing against the walls of your arteries. Orthostatic hypotension is a drop in blood pressure that can happen when you change positions, such as when you go from lying down to standing. Arteries are blood vessels that carry blood from your heart throughout your body. When blood pressure is too low, you may not get enough blood to your brain or to the rest of your organs. Orthostatic hypotension can cause light-headedness, sweating, rapid heartbeat, blurred vision, and fainting. These symptoms require further investigation into the cause. What are the causes? Orthostatic hypotension can be caused by many things, including: Sudden changes in posture, such as standing up quickly after you have been sitting or lying down. Loss of blood (anemia) or loss of body fluids (dehydration). Heart problems, neurologic problems, or hormone problems. Pregnancy. Aging. The risk for this condition increases as you get older. Severe infection (sepsis). Certain medicines, such as medicines for high blood pressure or medicines that make the body lose excess fluids (diuretics). What are the signs or symptoms? Symptoms of this condition may include: Weakness, light-headedness, or dizziness. Sweating. Blurred vision. Tiredness (fatigue). Rapid heartbeat. Fainting, in severe cases. How is this diagnosed? This condition is diagnosed based on: Your symptoms and medical history. Your blood pressure measurements. Your health care provider will check your blood pressure when you are: Lying down. Sitting. Standing. A blood pressure reading is recorded as two numbers, such as "120 over 80" (or 120/80). The first ("top") number is called the systolic pressure. It is a measure of the pressure  in your arteries as your heart beats. The second ("bottom") number is called the diastolic pressure. It is a measure of the pressure in your arteries when your heart relaxes between beats. Blood pressure is measured in a unit called mmHg. Healthy blood pressure for most adults is 120/80 mmHg. Orthostatic hypotension is defined as a 20 mmHg drop in systolic pressure or a 10 mmHg drop in diastolic pressure within 3 minutes of standing. Other information or tests that may be used to diagnose orthostatic hypotension include: Your other vital signs, such as your heart rate and temperature. Blood tests. An electrocardiogram (ECG) or echocardiogram. A Holter monitor. This is a device you wear that records your heart rhythm continuously, usually for 24-48 hours. Tilt table test. For this test, you will be safely secured to a table that moves you from a lying position to an upright position. Your heart rhythm and blood pressure will be monitored during the test. How is this treated? This condition may be treated by: Changing your diet. This may involve eating more salt (sodium) or drinking more water. Changing the dosage of certain medicines you are taking that might be lowering your blood pressure. Correcting the underlying reason for the orthostatic hypotension. Wearing compression stockings. Taking medicines to raise your blood pressure. Avoiding actions that trigger symptoms. Follow these instructions at home: Medicines Take over-the-counter and prescription medicines only as told by your health care provider. Follow instructions from your health care provider about changing the dosage of your current medicines, if this applies. Do not stop or adjust any of your medicines on your own. Eating and drinking  Drink enough fluid to keep your urine pale yellow. Eat extra salt only as directed. Do not add extra salt to your diet unless  advised by your health care provider. Eat frequent, small  meals. Avoid standing up suddenly after eating. General instructions  Get up slowly from lying down or sitting positions. This gives your blood pressure a chance to adjust. Avoid hot showers and excessive heat as directed by your health care provider. Engage in regular physical activity as directed by your health care provider. If you have compression stockings, wear them as told. Keep all follow-up visits. This is important. Contact a health care provider if: You have a fever for more than 2-3 days. You feel more thirsty than usual. You feel dizzy or weak. Get help right away if: You have chest pain. You have a fast or irregular heartbeat. You become sweaty or feel light-headed. You feel short of breath. You faint. You have any symptoms of a stroke. "BE FAST" is an easy way to remember the main warning signs of a stroke: B - Balance. Signs are dizziness, sudden trouble walking, or loss of balance. E - Eyes. Signs are trouble seeing or a sudden change in vision. F - Face. Signs are sudden weakness or numbness of the face, or the face or eyelid drooping on one side. A - Arms. Signs are weakness or numbness in an arm. This happens suddenly and usually on one side of the body. S - Speech. Signs are sudden trouble speaking, slurred speech, or trouble understanding what people say. T - Time. Time to call emergency services. Write down what time symptoms started. You have other signs of a stroke, such as: A sudden, severe headache with no known cause. Nausea or vomiting. Seizure. These symptoms may represent a serious problem that is an emergency. Do not wait to see if the symptoms will go away. Get medical help right away. Call your local emergency services (911 in the [place] Do not drive yourself to the hospital. Summary Orthostatic hypotension is a sudden drop in blood pressure. It can cause light-headedness, sweating, rapid heartbeat, blurred vision, and fainting. Orthostatic  hypotension can be diagnosed by having your blood pressure taken while lying down, sitting, and then standing. Treatment may involve changing your diet, wearing compression stockings, sitting up slowly, adjusting your medicines, or correcting the underlying reason for the orthostatic hypotension. Get help right away if you have chest pain, a fast or irregular heartbeat, or symptoms of a stroke. This information is not intended to replace advice given to you by your health care provider. Make sure you discuss any questions you have with your health care provider. Document Revised: 09/05/2020 Document Reviewed: 09/05/2020 Elsevier Patient Education  2024 [employer].

## 2023-06-18 NOTE — Progress Notes (Unsigned)
Established Patient Office Visit  Subjective   Patient ID: Regina Walters, female    DOB: 13-Dec-1945  Age: 77 y.o. MRN: [id number]  No chief complaint on file.   HPI Pt is a 77 yo female who presents to the clinic for follow up.   She was started on elavil for sleep and doing really well. She is sleeping. She denies any SEs. Her memory or balance is not worse per patient.   She continues to have frequent falls. She just feels like "she is being pulled down". She does not want to do PT. She does not want to do MRI. No major injuries with fall.   She needs prolia today.   Pt admits "my memory is not as good as it used to be". We have done memory screenings in the past.   She reports to be checking her BP at home and running under 130s over 80s. Denies any CP, palpitations, headaches or vision changes.    .. Active Ambulatory Problems    Diagnosis Date Noted   Acute on chronic cholecystitis s/p lap cholecystectomy 04/21/2019 04/10/2019   Gastroesophageal reflux disease 04/10/2019   Liver cyst 04/09/2013   Displaced fracture of proximal end of left humerus 06/09/2019   Elevated blood pressure reading without diagnosis of hypertension 10/11/2019   Right carotid bruit 10/11/2019   Pulsatile neck mass 10/13/2019   Systolic murmur 10/13/2019   Mixed hyperlipidemia 10/13/2019   Anxiety 05/20/2020   Primary insomnia 05/20/2020   Osteoporosis 01/03/2021   Fractured hand, left, closed, initial encounter 05/01/2021   Memory changes 07/09/2021   Hypokalemia 07/09/2021   White coat syndrome without diagnosis of hypertension 05/21/2023   Balance problem 06/01/2023   Frequent falls 06/01/2023   Bruising 06/01/2023   Tachycardia 06/01/2023   Resolved Ambulatory Problems    Diagnosis Date Noted   No Resolved Ambulatory Problems   Past Medical History:  Diagnosis Date   History of kidney stones    Kidney stone on left side 2014     ROS See HPI.    Objective:     BP  (!) 163/77   Pulse (!) 111   Wt 135 lb (61.2 kg)   SpO2 100%   BMI 28.22 kg/m  BP Readings from Last 3 Encounters:  06/18/23 (!) 163/77  05/28/23 (!) 174/89  05/21/23 (!) 172/78   Wt Readings from Last 3 Encounters:  06/18/23 135 lb (61.2 kg)  05/28/23 136 lb (61.7 kg)  05/21/23 136 lb 4 oz (61.8 kg)      Physical Exam Constitutional:      Appearance: Normal appearance.  HENT:     Head: Normocephalic.  Cardiovascular:     Rate and Rhythm: Normal rate.  Pulmonary:     Effort: Pulmonary effort is normal.  Neurological:     Mental Status: She is alert.  Psychiatric:        Mood and Affect: Mood normal.     ..    06/18/2023    4:10 PM 07/09/2021   11:40 AM  Montreal Cognitive Assessment   Visuospatial/ Executive (0/5) 3 4  Naming (0/3) 3 3  Attention: Read list of digits (0/2) 1 2  Attention: Read list of letters (0/1) 1 1  Attention: Serial 7 subtraction starting at 100 (0/3) 1 2  Language: Repeat phrase (0/2) 2 2  Language : Fluency (0/1) 0 0  Abstraction (0/2) 1 1  Delayed Recall (0/5) 1 3  Orientation (0/6) 4 6  Total  17 24  Adjusted Score (based on education)  25     The 10-year ASCVD risk score (Arnett DK, et al., 2019) is: 24.1%    Assessment & Plan:  Marland KitchenMarland KitchenDiagnoses and all orders for this visit:  Frequent falls  Age-related osteoporosis with current pathological fracture, initial encounter -     denosumab (PROLIA) injection 60 mg -     denosumab (PROLIA) injection 60 mg  Balance problem  Senility with cognitive changes without psychosis (HCC) -     donepezil (ARICEPT ODT) 5 MG disintegrating tablet; Take 1 tablet (5 mg total) by mouth at bedtime.  Primary insomnia -     amitriptyline (ELAVIL) 25 MG tablet; Take 1 tablet (25 mg total) by mouth at bedtime.   Due to frequent falls I ordered MRI on 11/26, pt declines and does not want this done She declines PT referral Make sure wearing compression stockings Per patient elavil is not making  worse and helping her sleep would like to stay on it  Osteoporosis-prolia done today, follow up in 6 months.   MOCA declined from last check quite signficantly Agreed to start aricept Would like to make neurology referral, pt declined  BP not to goal today Per patient home readings 130/80 or better at home     Return in about 3 months (around 09/16/2023) for Follow up.    Tandy Gaw, PA-C

## 2023-06-18 NOTE — Telephone Encounter (Signed)
Patient informed. 

## 2023-06-20 ENCOUNTER — Other Ambulatory Visit: Payer: Self-pay | Admitting: Physician Assistant

## 2023-06-22 ENCOUNTER — Encounter: Payer: Self-pay | Admitting: Physician Assistant

## 2023-08-10 ENCOUNTER — Other Ambulatory Visit: Payer: Self-pay | Admitting: Physician Assistant

## 2023-08-10 MED ORDER — AMITRIPTYLINE HCL 50 MG PO TABS: ORAL_TABLET | ORAL | 1 refills | Status: DC

## 2023-08-30 ENCOUNTER — Other Ambulatory Visit: Payer: Self-pay | Admitting: Physician Assistant

## 2023-08-30 NOTE — Telephone Encounter (Signed)
 Last Fill: 08/24/22  Last OV: 06/18/23 Next OV: 09/21/23  Routing to provider for review/authorization.

## 2023-08-30 NOTE — Telephone Encounter (Signed)
 Copied from CRM [id number]. Topic: Clinical - Medication Refill >> Aug 30, 2023  4:15 PM Thomes Dinning wrote: Most Recent Primary Care Visit:  Provider: Jomarie Longs  Department: [facility]  Visit Type: OFFICE VISIT  Date: 06/18/2023  Medication:  atorvastatin (LIPITOR) 10 MG tablet  Has the patient contacted their pharmacy? Yes (Agent: If no, request that the patient contact the pharmacy for the refill. If patient does not wish to contact the pharmacy document the reason why and proceed with request.) (Agent: If yes, when and what did the pharmacy advise?)  Is this the correct pharmacy for this prescription? Yes If no, delete pharmacy and type the correct one.  This is the patient's preferred pharmacy:  [id number] - [place], [place] - [address] [place] [address] Phone: [phone] Fax: [phone]   Has the prescription been filled recently? Yes  Is the patient out of the medication? Yes  Has the patient been seen for an appointment in the last year OR does the patient have an upcoming appointment? Yes  Can we respond through MyChart? Yes  Agent: Please be advised that Rx refills may take up to 3 business days. We ask that you follow-up with your pharmacy.

## 2023-08-31 MED ORDER — ATORVASTATIN CALCIUM 10 MG PO TABS: 10.0000 mg | ORAL_TABLET | Freq: Every day | ORAL | 0 refills | Status: DC

## 2023-09-16 ENCOUNTER — Other Ambulatory Visit: Payer: Self-pay | Admitting: Physician Assistant

## 2023-09-16 DIAGNOSIS — F0390 Unspecified dementia without behavioral disturbance: Secondary | ICD-10-CM

## 2023-09-16 DIAGNOSIS — F5101 Primary insomnia: Secondary | ICD-10-CM

## 2023-09-21 ENCOUNTER — Encounter: Payer: Self-pay | Admitting: Physician Assistant

## 2023-09-21 ENCOUNTER — Ambulatory Visit (INDEPENDENT_AMBULATORY_CARE_PROVIDER_SITE_OTHER): Payer: PPO | Admitting: Physician Assistant

## 2023-09-21 ENCOUNTER — Telehealth: Payer: Self-pay | Admitting: Physician Assistant

## 2023-09-21 VITALS — BP 180/84 | HR 96 | Ht <= 58 in | Wt 136.8 lb

## 2023-09-21 DIAGNOSIS — F5101 Primary insomnia: Secondary | ICD-10-CM | POA: Diagnosis not present

## 2023-09-21 DIAGNOSIS — Z79899 Other long term (current) drug therapy: Secondary | ICD-10-CM

## 2023-09-21 DIAGNOSIS — M8000XA Age-related osteoporosis with current pathological fracture, unspecified site, initial encounter for fracture: Secondary | ICD-10-CM

## 2023-09-21 DIAGNOSIS — R030 Elevated blood-pressure reading, without diagnosis of hypertension: Secondary | ICD-10-CM

## 2023-09-21 DIAGNOSIS — F0390 Unspecified dementia without behavioral disturbance: Secondary | ICD-10-CM

## 2023-09-21 MED ORDER — DONEPEZIL HCL 10 MG PO TBDP: 10.0000 mg | ORAL_TABLET | Freq: Every day | ORAL | 3 refills | Status: AC

## 2023-09-21 NOTE — Progress Notes (Unsigned)
   Established Patient Office Visit  Subjective   Patient ID: Regina Walters, female    DOB: 03-Sep-1945  Age: 78 y.o. MRN: [id number]  No chief complaint on file.   HPI  Patient Active Problem List   Diagnosis Date Noted   Balance problem 06/01/2023   Frequent falls 06/01/2023   Bruising 06/01/2023   Tachycardia 06/01/2023   White coat syndrome without diagnosis of hypertension 05/21/2023   Memory changes 07/09/2021   Hypokalemia 07/09/2021   Fractured hand, left, closed, initial encounter 05/01/2021   Osteoporosis 01/03/2021   Anxiety 05/20/2020   Primary insomnia 05/20/2020   Pulsatile neck mass 10/13/2019   Systolic murmur 10/13/2019   Mixed hyperlipidemia 10/13/2019   Elevated blood pressure reading without diagnosis of hypertension 10/11/2019   Right carotid bruit 10/11/2019   Displaced fracture of proximal end of left humerus 06/09/2019   Acute on chronic cholecystitis s/p lap cholecystectomy 04/21/2019 04/10/2019   Gastroesophageal reflux disease 04/10/2019   Liver cyst 04/09/2013   Past Medical History:  Diagnosis Date   History of kidney stones    Kidney stone on left side 2014   Past Surgical History:  Procedure Laterality Date   LAPAROSCOPIC CHOLECYSTECTOMY SINGLE SITE WITH INTRAOPERATIVE CHOLANGIOGRAM N/A 04/21/2019   Procedure: LAPAROSCOPIC CHOLECYSTECTOMY SINGLE SITE WITH INTRAOPERATIVE CHOLANGIOGRAM;  Surgeon: Karie Soda, MD;  Location: [facility];  Service: General;  Laterality: N/A;   ORIF HUMERUS FRACTURE Left 06/09/2019   Procedure: [facility] NAILING OF LEFT PROXIMAL HUMERUS;  Surgeon: Jodi Geralds, MD;  Location: [facility];  Service: Orthopedics;  Laterality: Left;   TONSILLECTOMY     Family History  Problem Relation Age of Onset   AAA (abdominal aortic aneurysm) Mother    COPD Mother    Lymphoma Father    Hypertension Other    Asthma Other    No Known Allergies    ROS    Objective:     There were no vitals taken for this  visit. BP Readings from Last 3 Encounters:  06/18/23 (!) 163/77  05/28/23 (!) 174/89  05/21/23 (!) 172/78   Wt Readings from Last 3 Encounters:  06/18/23 135 lb (61.2 kg)  05/28/23 136 lb (61.7 kg)  05/21/23 136 lb 4 oz (61.8 kg)      ..    06/18/2023    4:10 PM 07/09/2021   11:40 AM  Montreal Cognitive Assessment   Visuospatial/ Executive (0/5) 3 4  Naming (0/3) 3 3  Attention: Read list of digits (0/2) 1 2  Attention: Read list of letters (0/1) 1 1  Attention: Serial 7 subtraction starting at 100 (0/3) 1 2  Language: Repeat phrase (0/2) 2 2  Language : Fluency (0/1) 0 0  Abstraction (0/2) 1 1  Delayed Recall (0/5) 1 3  Orientation (0/6) 4 6  Total 17 24  Adjusted Score (based on education)  25    Physical Exam     The 10-year ASCVD risk score (Arnett DK, et al., 2019) is: 27%    Assessment & Plan:  Marland KitchenMarland KitchenThere are no diagnoses linked to this encounter.    Tandy Gaw, PA-C

## 2023-09-21 NOTE — Telephone Encounter (Signed)
 Lesly Rubenstein wants you to message her about Prolia. Can she schedule appointment for her grandmother or does she need to get nsurance approval.

## 2023-09-22 ENCOUNTER — Encounter: Payer: Self-pay | Admitting: Physician Assistant

## 2023-09-22 LAB — BMP8+EGFR
BUN/Creatinine Ratio: 12 (ref 12–28)
BUN: 9 mg/dL (ref 8–27)
CO2: 21 mmol/L (ref 20–29)
Calcium: 9.9 mg/dL (ref 8.7–10.3)
Chloride: 102 mmol/L (ref 96–106)
Creatinine, Ser: 0.74 mg/dL (ref 0.57–1.00)
Glucose: 111 mg/dL — ABNORMAL HIGH (ref 70–99)
Potassium: 4.2 mmol/L (ref 3.5–5.2)
Sodium: 140 mmol/L (ref 134–144)
eGFR: 84 mL/min/{1.73_m2} (ref >59)

## 2023-09-22 NOTE — Progress Notes (Signed)
 Ok to schedule prolia for patient.

## 2023-09-23 ENCOUNTER — Ambulatory Visit

## 2023-09-23 ENCOUNTER — Ambulatory Visit (INDEPENDENT_AMBULATORY_CARE_PROVIDER_SITE_OTHER): Admitting: Physician Assistant

## 2023-09-23 ENCOUNTER — Encounter: Payer: Self-pay | Admitting: Physician Assistant

## 2023-09-23 VITALS — BP 162/79 | HR 88 | Ht <= 58 in | Wt 136.8 lb

## 2023-09-23 DIAGNOSIS — M8000XA Age-related osteoporosis with current pathological fracture, unspecified site, initial encounter for fracture: Secondary | ICD-10-CM

## 2023-09-23 MED ORDER — DENOSUMAB 60 MG/ML ~~LOC~~ SOSY
60.0000 mg | PREFILLED_SYRINGE | Freq: Once | SUBCUTANEOUS | Status: DC
Start: 2023-10-07 — End: 2023-12-24

## 2023-09-23 MED ORDER — DENOSUMAB 60 MG/ML ~~LOC~~ SOSY
60.0000 mg | PREFILLED_SYRINGE | Freq: Once | SUBCUTANEOUS | Status: AC
Start: 2023-09-23 — End: 2023-09-23
  Administered 2023-09-23: 60 mg via SUBCUTANEOUS

## 2023-09-23 NOTE — Progress Notes (Signed)
 Pt is here for her prolia injection. Denies SOB, headache, or medication changes.                        Pt given 60mg  Prolia in LA. Tolerated well. No redness or swelling not at site.pt advised to RTC in 6 months (around 03/25/24)

## 2023-11-14 ENCOUNTER — Other Ambulatory Visit: Payer: Self-pay | Admitting: Physician Assistant

## 2023-11-26 ENCOUNTER — Other Ambulatory Visit: Payer: Self-pay | Admitting: Physician Assistant

## 2023-12-24 ENCOUNTER — Other Ambulatory Visit: Payer: Self-pay | Admitting: Physician Assistant

## 2023-12-24 MED ORDER — AMITRIPTYLINE HCL 100 MG PO TABS: 100.0000 mg | ORAL_TABLET | Freq: Every day | ORAL | 0 refills | Status: DC

## 2024-02-04 ENCOUNTER — Other Ambulatory Visit: Payer: Self-pay

## 2024-02-04 MED ORDER — ATORVASTATIN CALCIUM 10 MG PO TABS: 10.0000 mg | ORAL_TABLET | Freq: Every day | ORAL | 0 refills | Status: DC

## 2024-02-04 MED ORDER — POTASSIUM CHLORIDE ER 10 MEQ PO CPCR: 20.0000 meq | ORAL_CAPSULE | Freq: Every day | ORAL | 1 refills | Status: AC

## 2024-03-07 ENCOUNTER — Encounter: Payer: Self-pay | Admitting: Sports Medicine

## 2024-03-14 ENCOUNTER — Other Ambulatory Visit: Payer: Self-pay | Admitting: Physician Assistant

## 2024-03-14 DIAGNOSIS — F0390 Unspecified dementia without behavioral disturbance: Secondary | ICD-10-CM

## 2024-03-14 NOTE — Telephone Encounter (Signed)
 Spoke with E2C2 agent. Patient is requesting covid vaccine and [facility] needs a rx before she can get the vaccine.

## 2024-03-14 NOTE — Telephone Encounter (Signed)
 Pended vaccine prescription

## 2024-03-14 NOTE — Telephone Encounter (Signed)
 Ok to pend.

## 2024-03-15 MED ORDER — AMBULATORY NON FORMULARY MEDICATION: 0 refills | Status: AC

## 2024-03-20 ENCOUNTER — Other Ambulatory Visit: Payer: Self-pay

## 2024-03-20 ENCOUNTER — Telehealth: Payer: Self-pay

## 2024-03-20 ENCOUNTER — Other Ambulatory Visit (HOSPITAL_COMMUNITY): Payer: Self-pay

## 2024-03-20 ENCOUNTER — Other Ambulatory Visit: Payer: Self-pay | Admitting: Physician Assistant

## 2024-03-20 DIAGNOSIS — M810 Age-related osteoporosis without current pathological fracture: Secondary | ICD-10-CM

## 2024-03-20 MED ORDER — DENOSUMAB 60 MG/ML ~~LOC~~ SOSY: 60.0000 mg | PREFILLED_SYRINGE | Freq: Once | SUBCUTANEOUS | Status: DC

## 2024-03-20 NOTE — Progress Notes (Signed)
 Prolia ordered

## 2024-03-20 NOTE — Telephone Encounter (Signed)
   20% coinsurance, no deductible, no prior auth. needed 

## 2024-03-20 NOTE — Telephone Encounter (Signed)
 Prolia  VOB initiated via [email or web address]  Next Prolia  inj DUE: 03/2024

## 2024-03-20 NOTE — Telephone Encounter (Signed)
 Pt ready for scheduling for PROLIA  on or after : 03/25/24  Option# 1: Buy/Bill (Office supplied medication)  Out-of-pocket cost due at time of clinic visit: $332  Number of injection/visits approved: ---  Primary: [employer] Prolia  co-insurance: 20% Admin fee co-insurance: 0%  Secondary: --- Prolia  co-insurance:  Admin fee co-insurance:   Medical Benefit Details: Date Benefits were checked: 03/20/24 Deductible: NO/ Coinsurance: 20%/ Admin Fee: 0%  Prior Auth: N/A PA# Expiration Date:   # of doses approved: ----------------------------------------------------------------------- Option# 2- Med Obtained from pharmacy:  Prolia  is no longer preferred for pharmacy benefit. Jub[facility]i is now preferred. PRICING IS FOR JUBBONTI    Pharmacy benefit: Copay $250 (Paid to pharmacy) Admin Fee: 0% (Pay at clinic)  Prior Auth: N/A PA# Expiration Date:   # of doses approved:   If patient wants fill through the pharmacy benefit please send prescription to: [facility], and include estimated need by date in rx notes. Pharmacy will ship medication directly to the office.  Patient NOT eligible for Prolia  Copay Card. Copay Card can make patient's cost as little as $25. Link to apply: [email or web address]  ** This summary of benefits is an estimation of the patient's out-of-pocket cost. Exact cost may very based on individual plan coverage.

## 2024-03-21 ENCOUNTER — Other Ambulatory Visit: Payer: Self-pay

## 2024-03-23 ENCOUNTER — Ambulatory Visit

## 2024-03-27 ENCOUNTER — Ambulatory Visit: Admitting: Physician Assistant

## 2024-03-31 ENCOUNTER — Ambulatory Visit: Admitting: Physician Assistant

## 2024-03-31 DIAGNOSIS — R030 Elevated blood-pressure reading, without diagnosis of hypertension: Secondary | ICD-10-CM

## 2024-03-31 DIAGNOSIS — F5101 Primary insomnia: Secondary | ICD-10-CM

## 2024-04-14 ENCOUNTER — Encounter: Payer: Self-pay | Admitting: Physician Assistant

## 2024-04-14 DIAGNOSIS — R296 Repeated falls: Secondary | ICD-10-CM

## 2024-04-14 DIAGNOSIS — R42 Dizziness and giddiness: Secondary | ICD-10-CM

## 2024-04-14 DIAGNOSIS — F0390 Unspecified dementia without behavioral disturbance: Secondary | ICD-10-CM

## 2024-04-21 DIAGNOSIS — R42 Dizziness and giddiness: Secondary | ICD-10-CM | POA: Insufficient documentation

## 2024-05-05 ENCOUNTER — Other Ambulatory Visit: Payer: Self-pay | Admitting: Physician Assistant

## 2024-05-09 ENCOUNTER — Ambulatory Visit: Admitting: Physician Assistant

## 2024-05-09 VITALS — BP 138/72 | HR 77 | Wt 136.0 lb

## 2024-05-09 DIAGNOSIS — R296 Repeated falls: Secondary | ICD-10-CM

## 2024-05-09 DIAGNOSIS — R55 Syncope and collapse: Secondary | ICD-10-CM | POA: Diagnosis not present

## 2024-05-09 DIAGNOSIS — Z13220 Encounter for screening for lipoid disorders: Secondary | ICD-10-CM

## 2024-05-09 DIAGNOSIS — F0390 Unspecified dementia without behavioral disturbance: Secondary | ICD-10-CM | POA: Diagnosis not present

## 2024-05-09 NOTE — Progress Notes (Signed)
 Established Patient Office Visit  Subjective   Patient ID: Regina Walters, female    DOB: [birth year]  Age: 78 y.o. MRN: [id number]  Chief Complaint  Patient presents with   Medical Management of Chronic Issues    HPI Discussed the use of AI scribe software for clinical note transcription with the patient, who gave verbal consent to proceed.  History of Present Illness [name] is a 78 year old female who presents with frequent falls following cataract surgery.  Frequent falls and gait instability - Frequent falls over the past two months following cataract surgery - Three to four falls in total, most recent fall occurred about one week ago - Sensation of being pulled backwards prior to falling, often able to predict when a fall will occur - Able to get up independently after falls by pulling herself up on furniture - Uses a walker at home for ambulation - Avoids picking items up from the floor due to fall risk  Visual disturbances - Visual disturbances since cataract surgery - Left eye feels 'all scrunched up'  Dizziness and disequilibrium - Persistent sensation of being 'out of whack' and 'always dizzy' - No loss of consciousness or syncope during falls - No vertigo or sensation of the room spinning - No dizziness associated with head movement  Hypertension - Blood pressure usually elevated when comes into office.  - Recent home blood pressure readings around 139-140/70s.   Barriers to care - No MRI performed as previously ordered - No physical therapy due to cost concerns  Upper respiratory symptoms - No trouble breathing or congestion - Blows nose every morning     ROS See HPI.    Objective:     BP 138/72   Pulse 77   Wt 136 lb (61.7 kg)   BMI 28.42 kg/m  BP Readings from Last 3 Encounters:  05/23/24 138/72  09/23/23 (!) 162/79  09/21/23 (!) 180/84   Wt Readings from Last 3 Encounters:  05/23/24 136 lb (61.7 kg)   09/23/23 136 lb 12 oz (62 kg)  09/21/23 136 lb 12 oz (62 kg)      Physical Exam Constitutional:      Appearance: Normal appearance.  HENT:     Head: Normocephalic.  Cardiovascular:     Rate and Rhythm: Normal rate and regular rhythm.  Pulmonary:     Effort: Pulmonary effort is normal.     Breath sounds: Normal breath sounds.  Musculoskeletal:     Right lower leg: No edema.     Left lower leg: No edema.  Neurological:     General: No focal deficit present.     Mental Status: She is alert and oriented to person, place, and time.     Comments: Negative DixHallpike  Psychiatric:        Mood and Affect: Mood normal.     ..    09/21/2023   11:24 AM 06/18/2023    4:10 PM 07/09/2021   11:40 AM  Montreal Cognitive Assessment   Visuospatial/ Executive (0/5) 3 3 4   Naming (0/3) 3 3 3   Attention: Read list of digits (0/2) 2 1 2   Attention: Read list of letters (0/1) 1 1 1   Attention: Serial 7 subtraction starting at 100 (0/3) 2 1 2   Language: Repeat phrase (0/2) 2 2 2   Language : Fluency (0/1) 1 0 0  Abstraction (0/2) 2 1 1   Delayed Recall (0/5) 3 1 3   Orientation (0/6) 5 4 6  Total 24 17 24   Adjusted Score (based on education)   25      The 10-year ASCVD risk score (Arnett DK, et al., 2019) is: 20.5%    Assessment & Plan:  SABRASABRATwanda Stakes was seen today for medical management of chronic issues.  Diagnoses and all orders for this visit:  Frequent falls -     MR Brain W Wo Contrast; Future -     CMP14+EGFR -     TSH + free T4 -     B12 and Folate Panel -     CBC w/Diff/Platelet -     Fe+TIBC+Fer -     VITAMIN D 25 Hydroxy (Vit-D Deficiency, Fractures) -     Ambulatory referral to Neurology  Senility with cognitive changes without psychosis (HCC) -     MR Brain W Wo Contrast; Future -     CMP14+EGFR -     TSH + free T4 -     B12 and Folate Panel -     CBC w/Diff/Platelet -     Fe+TIBC+Fer -     VITAMIN D 25 Hydroxy (Vit-D Deficiency, Fractures) -      Lipid panel -     Ambulatory referral to Neurology  Syncope and collapse -     MR Brain W Wo Contrast; Future -     CMP14+EGFR -     TSH + free T4 -     B12 and Folate Panel -     CBC w/Diff/Platelet -     Fe+TIBC+Fer -     VITAMIN D 25 Hydroxy (Vit-D Deficiency, Fractures) -     Ambulatory referral to Neurology  Screening for lipid disorders -     Lipid panel   Assessment & Plan Repeated falls and dizziness Experienced repeated falls post-cataract surgery with dizziness and imbalance. No vertigo or syncope. Falls mostly in the afternoon. - Order MRI of the head. - Refer to neurology for further evaluation of falls. - Consider physical therapy for balance improvement, potentially with home visits.  Hypertension Blood pressure lower than usual. Typically around 139-140 mmHg at home. - orthostatic BP normal - negative [address] McRae, PA-C

## 2024-05-09 NOTE — Patient Instructions (Signed)
 Orthostatic BP normal.  Will get MRI and make referral to neurology.

## 2024-05-10 ENCOUNTER — Telehealth: Payer: Self-pay | Admitting: Physician Assistant

## 2024-05-10 ENCOUNTER — Ambulatory Visit: Payer: Self-pay | Admitting: Physician Assistant

## 2024-05-10 LAB — CMP14+EGFR
ALT: 14 IU/L (ref 0–32)
AST: 18 IU/L (ref 0–40)
Albumin: 4 g/dL (ref 3.8–4.8)
Alkaline Phosphatase: 98 IU/L (ref 49–135)
BUN/Creatinine Ratio: 12 (ref 12–28)
BUN: 8 mg/dL (ref 8–27)
Bilirubin Total: 0.2 mg/dL (ref 0.0–1.2)
CO2: 22 mmol/L (ref 20–29)
Calcium: 9.9 mg/dL (ref 8.7–10.3)
Chloride: 102 mmol/L (ref 96–106)
Creatinine, Ser: 0.69 mg/dL (ref 0.57–1.00)
Globulin, Total: 2.6 g/dL (ref 1.5–4.5)
Glucose: 109 mg/dL — ABNORMAL HIGH (ref 70–99)
Potassium: 3.7 mmol/L (ref 3.5–5.2)
Sodium: 141 mmol/L (ref 134–144)
Total Protein: 6.6 g/dL (ref 6.0–8.5)
eGFR: 90 mL/min/1.73 (ref >59)

## 2024-05-10 LAB — LIPID PANEL
Chol/HDL Ratio: 2.5 ratio (ref 0.0–4.4)
Cholesterol, Total: 180 mg/dL (ref 100–199)
HDL: 73 mg/dL (ref >39)
LDL Chol Calc (NIH): 93 mg/dL (ref 0–99)
Triglycerides: 73 mg/dL (ref 0–149)
VLDL Cholesterol Cal: 14 mg/dL (ref 5–40)

## 2024-05-10 LAB — IRON,TIBC AND FERRITIN PANEL
Ferritin: 36 ng/mL (ref 15–150)
Iron Saturation: 24 % (ref 15–55)
Iron: 70 ug/dL (ref 27–139)
Total Iron Binding Capacity: 295 ug/dL (ref 250–450)
UIBC: 225 ug/dL (ref 118–369)

## 2024-05-10 LAB — CBC WITH DIFFERENTIAL/PLATELET
Basophils Absolute: 0 x10E3/uL (ref 0.0–0.2)
Basos: 1 %
EOS (ABSOLUTE): 0.1 x10E3/uL (ref 0.0–0.4)
Eos: 1 %
Hematocrit: 39.4 % (ref 34.0–46.6)
Hemoglobin: 12.8 g/dL (ref 11.1–15.9)
Immature Grans (Abs): 0 x10E3/uL (ref 0.0–0.1)
Immature Granulocytes: 0 %
Lymphocytes Absolute: 0.8 x10E3/uL (ref 0.7–3.1)
Lymphs: 12 %
MCH: 29.6 pg (ref 26.6–33.0)
MCHC: 32.5 g/dL (ref 31.5–35.7)
MCV: 91 fL (ref 79–97)
Monocytes Absolute: 0.3 x10E3/uL (ref 0.1–0.9)
Monocytes: 5 %
Neutrophils Absolute: 5.6 x10E3/uL (ref 1.4–7.0)
Neutrophils: 81 %
Platelets: 211 x10E3/uL (ref 150–450)
RBC: 4.33 x10E6/uL (ref 3.77–5.28)
RDW: 12.5 % (ref 11.7–15.4)
WBC: 6.8 x10E3/uL (ref 3.4–10.8)

## 2024-05-10 LAB — TSH+FREE T4
Free T4: 1.19 ng/dL (ref 0.82–1.77)
TSH: 1.79 u[IU]/mL (ref 0.450–4.500)

## 2024-05-10 LAB — B12 AND FOLATE PANEL
Folate: >20 ng/mL (ref >3.0)
Vitamin B-12: 923 pg/mL (ref 232–1245)

## 2024-05-10 LAB — VITAMIN D 25 HYDROXY (VIT D DEFICIENCY, FRACTURES): Vit D, 25-Hydroxy: 53.7 ng/mL (ref 30.0–100.0)

## 2024-05-10 NOTE — Progress Notes (Signed)
 Labs look great.

## 2024-05-10 NOTE — Telephone Encounter (Signed)
 Copied from CRM [id number]. Topic: Appointments - Appointment Info/Confirmation >> May 10, 2024 11:30 AM Montie POUR wrote: Patient/patient representative is calling for information regarding an appointment.  Please call Ryka to schedule her appointment for the Prolia  injection. Her number is [phone].  Patient scheduled

## 2024-05-10 NOTE — Telephone Encounter (Unsigned)
 Copied from CRM [id number]. Topic: Appointments - Scheduling Inquiry for Clinic >> May 10, 2024 11:55 AM Avram MATSU wrote: Reason for CRM: patient is calling to get an appt to get her shot for prolia . Please advise [phone]

## 2024-05-16 ENCOUNTER — Other Ambulatory Visit: Payer: Self-pay

## 2024-05-16 ENCOUNTER — Telehealth: Payer: Self-pay

## 2024-05-16 ENCOUNTER — Ambulatory Visit

## 2024-05-16 MED ORDER — DENOSUMAB-BBDZ 60 MG/ML ~~LOC~~ SOSY
60.0000 mg | PREFILLED_SYRINGE | Freq: Once | SUBCUTANEOUS | 0 refills | Status: AC
  Filled 2024-05-23: qty 1, 180d supply, fill #0
  Filled 2024-05-23: qty 1, 1d supply, fill #0

## 2024-05-16 NOTE — Telephone Encounter (Signed)
 Copied from CRM [id number]. Topic: Appointments - Scheduling Inquiry for Clinic >> May 16, 2024  8:30 AM Kendralyn S wrote: Reason for CRM: calling to schedule for a prolia  shot

## 2024-05-16 NOTE — Telephone Encounter (Signed)
 Reached out to patient to reschedule her injection as she would like to get from pharmacy because the copay is cheaper.

## 2024-05-16 NOTE — Telephone Encounter (Signed)
 Spoke with Tiffany at [employer]  The order for Prolia  showing in patient chart sent 03/20/2024 was never received at the pharmacy  The last prescription showing in chart was one that was administered.   Sent for Jubbonti to [employer] and will await receipt of medication or PA if needed again .

## 2024-05-16 NOTE — Progress Notes (Signed)
 Pharmacy Patient Advocate Encounter  Insurance verification completed.   The patient is insured through [facility] ADVANTAGE/RX ADVANCE   Ran test claim for Jubbonti . Co-pay is $250.  This test claim was processed through [facility]- copay amounts may vary at other pharmacies due to pharmacy/plan contracts, or as the patient moves through the different stages of their insurance plan.

## 2024-05-16 NOTE — Telephone Encounter (Signed)
 Attempted call to Tiffany at [facility]. Left a voice mail message requesting a return call.  Order was placed for prolia  on 03/20/2024 by Anglea But PA returned that must be filled as generic.  Needing to know if new order needs to be placed specifying generic now ?

## 2024-05-16 NOTE — Addendum Note (Signed)
 Addended by: Kelce Bouton P on: 05/16/2024 02:50 PM   Modules accepted: Orders

## 2024-05-17 ENCOUNTER — Other Ambulatory Visit: Payer: Self-pay

## 2024-05-19 ENCOUNTER — Other Ambulatory Visit: Payer: Self-pay

## 2024-05-23 ENCOUNTER — Encounter: Payer: Self-pay | Admitting: Physician Assistant

## 2024-05-23 ENCOUNTER — Other Ambulatory Visit: Payer: Self-pay

## 2024-05-23 NOTE — Telephone Encounter (Signed)
 Called Tiffany at [facility] - she states she has been having difficulty contacting the patient to collect he co-pay for the Prolia  and this is the reason why we have not received the medication as of yet. She is going to send the patient a Mychart message to see if she can proceed with the order after collection of co-pay.

## 2024-05-23 NOTE — Progress Notes (Signed)
 Specialty Pharmacy Initial Fill Coordination Note  Regina Walters is a 78 y.o. female contacted today regarding initial fill of specialty medication(s) Denosumab -bbdz BARTON)   Patient requested Courier to Provider Office   Delivery date: 05/25/24   Verified address: [facility] at [facility] [address]   Medication will be filled on: 05/24/24   Patient is aware of $250 copayment.

## 2024-05-23 NOTE — Telephone Encounter (Signed)
 Medication is Jubbonti and is preferred by the [employer] for coverage. Patient was informed that this was the medication being sent at initial call as well as call today  - incorrectly documented as Prolia  below.

## 2024-05-23 NOTE — Telephone Encounter (Signed)
 Attempted call to Tiffany at [facility] to check on the prescription ETD. Left a voice mail message requesting a return call.

## 2024-05-23 NOTE — Telephone Encounter (Signed)
 Patient and patient son informed that Tiffany at [facility] has been reaching out to collect he co-pay  for the Prolia  injection.  They will reach out to Tiffany and when we receive the medication we contact them for scheduling this.

## 2024-05-23 NOTE — Telephone Encounter (Signed)
 Copied from CRM #[id number]. Topic: General - Other >> May 23, 2024  9:37 AM Darshell M wrote: Reason for CRM: Patient calling to check on status of Prolia  injection. Patient saw provider last week and the Prolia  was not available. Patient was advised Prolia  would be delivered to clinic that evening and someone would call to schedule patient for injection. Patient has not received a call. Patient CB#[phone] >> May 23, 2024 10:54 AM Ivette P wrote: Pt called in and wanted an update advised message was sent should get a call before end of day today.  Please follow up with pt

## 2024-05-24 ENCOUNTER — Encounter: Payer: Self-pay | Admitting: Physician Assistant

## 2024-05-24 ENCOUNTER — Other Ambulatory Visit: Payer: Self-pay

## 2024-05-26 ENCOUNTER — Ambulatory Visit (INDEPENDENT_AMBULATORY_CARE_PROVIDER_SITE_OTHER)

## 2024-05-26 ENCOUNTER — Telehealth: Payer: Self-pay

## 2024-05-26 VITALS — BP 172/72 | HR 89 | Temp 98.2°F | Ht <= 58 in

## 2024-05-26 DIAGNOSIS — M810 Age-related osteoporosis without current pathological fracture: Secondary | ICD-10-CM | POA: Diagnosis not present

## 2024-05-26 MED ORDER — LOSARTAN POTASSIUM 25 MG PO TABS: 25.0000 mg | ORAL_TABLET | Freq: Every day | ORAL | 0 refills | Status: DC

## 2024-05-26 MED ORDER — DENOSUMAB-BBDZ 60 MG/ML ~~LOC~~ SOSY: 60.0000 mg | PREFILLED_SYRINGE | Freq: Once | SUBCUTANEOUS | Status: AC

## 2024-05-26 MED ORDER — DENOSUMAB-BBDZ 60 MG/ML ~~LOC~~ SOSY
60.0000 mg | PREFILLED_SYRINGE | Freq: Once | SUBCUTANEOUS | Status: AC
  Administered 2024-05-26: 60 mg via SUBCUTANEOUS

## 2024-05-26 NOTE — Patient Instructions (Signed)
 Return in 2 weeks for nurse visit for BP check.  Return in 6 months and one day for Jubbonit administration as nurse visit.

## 2024-05-26 NOTE — Telephone Encounter (Signed)
 In training today with Luster Bunde - initial referral attached to chart was found to be incorrect.  She asked that our billing person be informed to be sure to un as referral #  [id number] that is now correctly linked to patient in appt tab.  Thank you.

## 2024-05-26 NOTE — Addendum Note (Signed)
 Addended by: ANTONIETTE VERMELL CROME on: 05/26/2024 12:30 PM   Modules accepted: Orders

## 2024-05-26 NOTE — Progress Notes (Signed)
   Established Patient Office Visit  Subjective   Patient ID: Regina Walters, female    DOB: [birth year]  Age: 78 y.o. MRN: [id number]  Chief Complaint  Patient presents with   Osteoporosis    Nurse visit for Jubbonti  administration.     HPI  Osteoporosis - nurse visit for  Jubbonti  administration. Patient has been taking Vit D and Calcium  daily. Patient last lab results from 05/09/2024 showing normal calcium  and kidney function. Last given Prolia  injeciton on 09/23/2023. BP shows elevated on check-in on both initial and second reading . Patient had fallen last week and had bruising showing on bilateral upper arms and a healing bruise on forehead at  right sided scalp line .   ROS    Objective:     BP (!) 172/72   Pulse 89   Temp 98.2 F (36.8 C)   Ht 4' 10 (1.473 m)   SpO2 99%   BMI 28.42 kg/m    Physical Exam   No results found for any visits on 05/26/24.    The 10-year ASCVD risk score (Arnett DK, et al., 2019) is: 29.8%    Assessment & Plan:  Jubbonti  injection admin 60mg  SQ right arm at patient's request. Pateint tolerated injection well without complications.  Patient will return in 6 months and one day for next injection of Jubbonti  as nurse visit.  Due to elevated BP readings in office today  Regina Bologna, PA is going to add Losartan  25mg  once daily and patient will return for nurse visit BP check in 2 weeks.  Problem List Items Addressed This Visit       Musculoskeletal and Integument   Osteoporosis - Primary   Relevant Medications   denosumab -bbdz (JUBBONTI ) injection 60 mg (Start on 11/24/2024 12:00 AM)    Return in about 26 weeks (around 11/24/2024) for Jubbonti  admin as nurse visit.    Regina SHAUNNA Plenty, LPN

## 2024-05-30 NOTE — Telephone Encounter (Signed)
 Attempted call to patient. Left a voice mail message requesting a return call.

## 2024-05-30 NOTE — Progress Notes (Cosign Needed)
 Regina Walters is a very pleasant 78 y.o. year old RH female with a history of hypertension, hyperlipidemia, recent syncope with collapse seen today for evaluation of memory loss and frequent falls. MoCA today is 17/30.  The patient is accompanied by her husband  who supplements  the history.  Of note, the patient is already on donepezil  ODT 10 mg nightly Assessment & Plan Memory impairment Difficulty in short-term memory and word-finding. Long-term memory remains intact. Differential diagnosis includes Alzheimer's disease, vascular dementia among others Current treatment with donepezil  10 mg nightly has stabilized memory  - Ordered neurocognitive testing to assess memory function - Continue donepezil  10 mg nightly - Ordered MRI of the brain to rule out structural abnormalities and vascular load - Will consider adding a second medication if memory worsens  Imbalance and dizziness after cataract surgery with history of frequent falls Imbalance and dizziness post-cataract surgery, leading to frequent falls. Vision issues in the left eye due to intraocular lens complication. Blood pressure management has reduced fall frequency. No syncope or vertigo reported. No evidence of stroke or other neurological causes identified. - Ordered MRI of the brain to rule out structural abnormalities or old strokes - Consider physical therapy for strength and balance if desired - Monitor blood pressure to ensure it remains controlled  Bilateral cataract surgery with left intraocular lens complication Bilateral cataract surgery with complications in the left eye due to intraocular lens not lying properly. Scheduled for laser revision on January 20th. Vision issues contributing to imbalance and falls. - Proceed with scheduled laser revision on January 20th  Hypertension, currently managed Hypertension managed with losartan  25 mg. Blood pressure control has improved fall frequency. No recent  presyncope or vertigo reported. - Continue losartan  25 mg - Monitor blood pressure regularly  Insomnia Chronic insomnia with difficulty staying asleep. Managed with hemp gummies and sleeping medication. Sleep quality remains poor despite current regimen. - Continue current sleep regimen   Bilateral foot pain and burning, concern for neuropathy  Bilateral foot pain and burning No diabetes or elevated blood sugar levels.B12 normal. Symptoms may be related to vitamin B6 deficiency or other causes. - Checked vitamin B6 levels - Will consider electromyogram or nerve conduction study if symptoms persist Patient declines aggressive measures at this time despite recommending PT/OT as well. She agrees with B6 and A1C   Discussed the use of AI scribe software for clinical note transcription with the patient, who gave verbal consent to proceed.  History of Present Illness  Regina Walters is a 78 year old female who presents with memory loss. She is accompanied by her husband. She was referred by her family doctor for further evaluation of memory issues.  She has been experiencing memory difficulties for about a year, with worsening over the last few months. She has trouble with short-term memory, particularly with word-finding, but her long-term memory remains intact. No repetition of stories or disorientation in familiar places. She takes medication for memory, which has helped slightly.  Her sleep is problematic, with difficulty staying asleep, but hemp gummies and sleeping medication help. No hallucinations, paranoia, or significant personality changes, though she experiences frustration due to memory and vision issues. Manages medications independently and is in charge of finances.   She has a history of frequent falls, which have increased since her cataract surgery. She uses a right cane for stability and experiences  imbalance, but no vertigo or syncope. Falls  have decreased  recently maybe once a week. She has imbalance and has issues  with the lens in her left eye, which is scheduled for revision surgery in January. She reports some burning L>R, but denies a history of neuropathy.  She is not interested at this time to pursue aggressive testing.   She has a history of hypothyroidism, hypertension, and hyperlipidemia. Recently started on losartan  25 mg for high blood pressure. Thyroid  levels are normal, and recent labs showed no anemia or vitamin deficiencies. No history of stroke, seizures, or diabetes.  Her mother had dementia, passing away at age 32. She denies heavy alcohol or tobacco use. Lives with her husband and does not drive.      Results Lipid Panel     Component Value Date/Time   CHOL 180 05/09/2024 1218   TRIG 73 05/09/2024 1218   HDL 73 05/09/2024 1218   CHOLHDL 2.5 05/09/2024 1218   CHOLHDL 2.5 07/01/2022 0946   LDLCALC 93 05/09/2024 1218   LDLCALC 80 07/01/2022 0946   LABVLDL 14 05/09/2024 1218      LABS Iron: decreased (05/09/2024) Vitamin D : within normal limits (05/09/2024) Lipid panel: within normal limits (05/09/2024) CBC: within normal limits (05/09/2024) Vitamin B12: elevated (>900) (05/09/2024) Iron/TIBC/Ferritin/ %Sat    Component Value Date/Time   IRON 70 05/09/2024 1216   TIBC 295 05/09/2024 1216   FERRITIN 36 05/09/2024 1216   IRONPCTSAT 24 05/09/2024 1216       Latest Ref Rng & Units 05/09/2024   12:16 PM 05/28/2023    2:34 PM 07/04/2021   12:00 AM  CBC  WBC 3.4 - 10.8 x10E3/uL 6.8  7.1  5.6   Hemoglobin 11.1 - 15.9 g/dL 87.1  84.7  86.5   Hematocrit 34.0 - 46.6 % 39.4  45.8  39.5   Platelets 150 - 450 x10E3/uL 211  256  205      Vit B12 923 Vit D25 :53.7 TSH 1.79 with Free T4 1.19   CMP     Latest Ref Rng & Units 05/09/2024   12:16 PM 09/21/2023   11:15 AM 05/28/2023    2:34 PM  CMP  Glucose 70 - 99 mg/dL 890  888  892   BUN 8 - 27 mg/dL 8  9  6    Creatinine 0.57 - 1.00 mg/dL 9.30  9.25  9.30    Sodium 134 - 144 mmol/L 141  140  138   Potassium 3.5 - 5.2 mmol/L 3.7  4.2  4.1   Chloride 96 - 106 mmol/L 102  102  102   CO2 20 - 29 mmol/L 22  21  22    Calcium  8.7 - 10.3 mg/dL 9.9  9.9  9.8   Total Protein 6.0 - 8.5 g/dL 6.6   7.3   Total Bilirubin 0.0 - 1.2 mg/dL 0.2   0.5   Alkaline Phos 49 - 135 IU/L 98   85   AST 0 - 40 IU/L 18   27   ALT 0 - 32 IU/L 14   20     No Known Allergies  Current Outpatient Medications  Medication Instructions   AMBULATORY NON FORMULARY MEDICATION Joint support OTC Memory and Focus OTC   AMBULATORY NON FORMULARY MEDICATION Medication Name: COVID vaccine 2025-2026 DDx: Over 65.   amitriptyline  (ELAVIL ) 100 MG tablet TAKE 1 TABLET(100 MG) BY MOUTH AT BEDTIME   Apoaequorin (PREVAGEN PO) 1 tablet, Daily   atorvastatin  (LIPITOR) 10 MG tablet TAKE 1 TABLET(10 MG) BY MOUTH DAILY  calcium -vitamin D  250-100 MG-UNIT tablet 1 tablet, Daily   donepezil  (ARICEPT  ODT) 10 mg, Oral, Daily at bedtime   esomeprazole (NEXIUM) 40 mg, Daily before breakfast   ibuprofen (ADVIL) 600 mg, Every 6 hours PRN   losartan  (COZAAR ) 25 mg, Oral, Daily   potassium chloride  (MICRO-K ) 10 MEQ CR capsule 20 mEq, Oral, Daily     VITALS:   Vitals:   06/05/24 1319  BP: (!) 177/85  Pulse: 72  Resp: 18  SpO2: 98%  Weight: 143 lb (64.9 kg)  Height: 4' 10 (1.473 m)     Neurological Exam     06/05/2024    3:00 PM 09/21/2023   11:24 AM 06/18/2023    4:10 PM 07/09/2021   11:40 AM  Montreal Cognitive Assessment   Visuospatial/ Executive (0/5) 1 3 3 4   Naming (0/3) 3 3 3 3   Attention: Read list of digits (0/2) 2 2 1 2   Attention: Read list of letters (0/1) 1 1 1 1   Attention: Serial 7 subtraction starting at 100 (0/3) 0 2 1 2   Language: Repeat phrase (0/2) 2 2 2 2   Language : Fluency (0/1) 0 1 0 0  Abstraction (0/2) 0 2 1 1   Delayed Recall (0/5) 3 3 1 3   Orientation (0/6) 4 5 4 6   Total 16 24 17 24   Adjusted Score (based on education) 17   25        No data to  display             Orientation:  Alert and oriented to person, not to place and not to time. No aphasia or dysarthria. Fund of knowledge is appropriate. Recent and remote memory impaired.  Attention and concentration are reduced.  Able to name objects and repeat phrases.  Delayed recall 3 /5 .  Cranial nerves: There is good facial symmetry. Extraocular muscles are intact and visual fields are full to confrontational testing. Speech is fluent and clear. No tongue deviation. Hearing is intact to conversational tone.  Tone: Tone is good throughout. Sensation: Sensation is intact to light touch.  Vibration is intact at the bilateral big toe.  Coordination: The patient has no difficulty with RAM's or FNF bilaterally. Normal finger to nose  Motor: Strength is 5/5 in the bilateral upper and lower extremities. There is no pronator drift. There are no fasciculations noted. DTR's: Deep tendon reflexes are 2/4 bilaterally. Gait and Station: The patient is able to ambulate without difficulty. Gait is cautious and narrow, slow.    Thank you for allowing us  the opportunity to participate in the care of this nice patient. Please do not hesitate to contact us  for any questions or concerns.   Total time spent on today's visit was 49 minutes dedicated to this patient today, preparing to see patient, examining the patient, ordering tests and/or medications and counseling the patient, documenting clinical information in the EHR or other health record, independently interpreting results and communicating results to the patient/family, discussing treatment and goals, answering patient's questions and coordinating care.  Cc:  Breeback, Jade L, PA-C  Nhi Butrum 06/05/2024 3:17 PM

## 2024-06-05 ENCOUNTER — Encounter: Payer: Self-pay | Admitting: Physician Assistant

## 2024-06-05 ENCOUNTER — Ambulatory Visit: Payer: Self-pay | Admitting: Physician Assistant

## 2024-06-05 ENCOUNTER — Ambulatory Visit

## 2024-06-05 ENCOUNTER — Other Ambulatory Visit

## 2024-06-05 VITALS — BP 177/85 | HR 72 | Resp 18 | Ht <= 58 in | Wt 143.0 lb

## 2024-06-05 DIAGNOSIS — R413 Other amnesia: Secondary | ICD-10-CM | POA: Diagnosis not present

## 2024-06-05 DIAGNOSIS — R202 Paresthesia of skin: Secondary | ICD-10-CM

## 2024-06-05 NOTE — Patient Instructions (Addendum)
 It was a pleasure to see you today at our office.   Recommendations:  Neurocognitive evaluation at our office   MRI of the brain  Check labs today    Continue donepezil  as instructed Monitor the BP Follow up in 3 months    [email or web address]   RECOMMENDATIONS FOR ALL PATIENTS WITH MEMORY PROBLEMS: 1. Continue to exercise (Recommend 30 minutes of walking everyday, or 3 hours every week) 2. Increase social interactions - continue going to [place] and enjoy social gatherings with friends and family 3. Eat healthy, avoid fried foods and eat more fruits and vegetables 4. Maintain adequate blood pressure, blood sugar, and blood cholesterol level. Reducing the risk of stroke and cardiovascular disease also helps promoting better memory. 5. Avoid stressful situations. Live a simple life and avoid aggravations. Organize your time and prepare for the next day in anticipation. 6. Sleep well, avoid any interruptions of sleep and avoid any distractions in the bedroom that may interfere with adequate sleep quality 7. Avoid sugar, avoid sweets as there is a strong link between excessive sugar intake, diabetes, and cognitive impairment We discussed the Mediterranean diet, which has been shown to help patients reduce the risk of progressive memory disorders and reduces cardiovascular risk. This includes eating fish, eat fruits and green leafy vegetables, nuts like almonds and hazelnuts, walnuts, and also use olive oil. Avoid fast foods and fried foods as much as possible. Avoid sweets and sugar as sugar use has been linked to worsening of memory function.  There is always a concern of gradual progression of memory problems. If this is the case, then we may need to adjust level of care according to patient needs. Support, both to the patient and caregiver, should then be put into place.      You have been referred for a neuropsychological evaluation  (i.e., evaluation of memory and thinking abilities). Please bring someone with you to this appointment if possible, as it is helpful for the doctor to hear from both you and another adult who knows you well. Please bring eyeglasses and hearing aids if you wear them.    The evaluation will take approximately 3 hours and has two parts:   The first part is a clinical interview with the neuropsychologist (Dr. Richie or Dr. Gayland). During the interview, the neuropsychologist will speak with you and the individual you brought to the appointment.    The second part of the evaluation is testing with the doctor's technician Neal or Luke). During the testing, the technician will ask you to remember different types of material, solve problems, and answer some questionnaires. Your family member will not be present for this portion of the evaluation.   Please note: We must reserve several hours of the neuropsychologist's time and the psychometrician's time for your evaluation appointment. As such, there is a No-Show fee of $100. If you are unable to attend any of your appointments, please contact our office as soon as possible to reschedule.      DRIVING: Regarding driving, in patients with progressive memory problems, driving will be impaired. We advise to have someone else do the driving if trouble finding directions or if minor accidents are reported. Independent driving assessment is available to determine safety of driving.   If you are interested in the driving assessment, you can contact the following:  [facility] [employer] in [place] [phone]  [facility] [phone]  [facility] [phone]  [facility] [phone] or [phone]   FALL PRECAUTIONS:  Be cautious when walking. Scan the area for obstacles that may increase the risk of trips and falls. When getting up in the mornings, sit up at the edge of the bed for a few minutes before getting out  of bed. Consider elevating the bed at the head end to avoid drop of blood pressure when getting up. Walk always in a well-lit room (use night lights in the walls). Avoid area rugs or power cords from appliances in the middle of the walkways. Use a walker or a cane if necessary and consider physical therapy for balance exercise. Get your eyesight checked regularly.  FINANCIAL OVERSIGHT: Supervision, especially oversight when making financial decisions or transactions is also recommended.  HOME SAFETY: Consider the safety of the kitchen when operating appliances like stoves, microwave oven, and blender. Consider having supervision and share cooking responsibilities until no longer able to participate in those. Accidents with firearms and other hazards in the house should be identified and addressed as well.   ABILITY TO BE LEFT ALONE: If patient is unable to contact 911 operator, consider using LifeLine, or when the need is there, arrange for someone to stay with patients. Smoking is a fire hazard, consider supervision or cessation. Risk of wandering should be assessed by caregiver and if detected at any point, supervision and safe proof recommendations should be instituted.  MEDICATION SUPERVISION: Inability to self-administer medication needs to be constantly addressed. Implement a mechanism to ensure safe administration of the medications.      Mediterranean Diet A Mediterranean diet refers to food and lifestyle choices that are based on the traditions of countries located on the [place]. This way of eating has been shown to help prevent certain conditions and improve outcomes for people who have chronic diseases, like kidney disease and heart disease. What are tips for following this plan? Lifestyle  Cook and eat meals together with your family, when possible. Drink enough fluid to keep your urine clear or pale yellow. Be physically active every day. This includes: Aerobic exercise  like running or swimming. Leisure activities like gardening, walking, or housework. Get 7-8 hours of sleep each night. If recommended by your health care provider, drink red wine in moderation. This means 1 glass a day for nonpregnant women and 2 glasses a day for men. A glass of wine equals 5 oz (150 mL). Reading food labels  Check the serving size of packaged foods. For foods such as rice and pasta, the serving size refers to the amount of cooked product, not dry. Check the total fat in packaged foods. Avoid foods that have saturated fat or trans fats. Check the ingredients list for added sugars, such as corn syrup. Shopping  At the grocery store, buy most of your food from the areas near the walls of the store. This includes: Fresh fruits and vegetables (produce). Grains, beans, nuts, and seeds. Some of these may be available in unpackaged forms or large amounts (in bulk). Fresh seafood. Poultry and eggs. Low-fat dairy products. Buy whole ingredients instead of prepackaged foods. Buy fresh fruits and vegetables in-season from local farmers markets. Buy frozen fruits and vegetables in resealable bags. If you do not have access to quality fresh seafood, buy precooked frozen shrimp or canned fish, such as tuna, salmon, or sardines. Buy small amounts of raw or cooked vegetables, salads, or olives from the deli or salad bar at your store. Stock your pantry so you always have certain foods on hand, such as olive oil, canned tuna,  canned tomatoes, rice, pasta, and beans. Cooking  Cook foods with extra-virgin olive oil instead of using butter or other vegetable oils. Have meat as a side dish, and have vegetables or grains as your main dish. This means having meat in small portions or adding small amounts of meat to foods like pasta or stew. Use beans or vegetables instead of meat in common dishes like chili or lasagna. Experiment with different cooking methods. Try roasting or broiling vegetables  instead of steaming or sauteing them. Add frozen vegetables to soups, stews, pasta, or rice. Add nuts or seeds for added healthy fat at each meal. You can add these to yogurt, salads, or vegetable dishes. Marinate fish or vegetables using olive oil, lemon juice, garlic, and fresh herbs. Meal planning  Plan to eat 1 vegetarian meal one day each week. Try to work up to 2 vegetarian meals, if possible. Eat seafood 2 or more times a week. Have healthy snacks readily available, such as: Vegetable sticks with hummus. Greek yogurt. Fruit and nut trail mix. Eat balanced meals throughout the week. This includes: Fruit: 2-3 servings a day Vegetables: 4-5 servings a day Low-fat dairy: 2 servings a day Fish, poultry, or lean meat: 1 serving a day Beans and legumes: 2 or more servings a week Nuts and seeds: 1-2 servings a day Whole grains: 6-8 servings a day Extra-virgin olive oil: 3-4 servings a day Limit red meat and sweets to only a few servings a month What are my food choices? Mediterranean diet Recommended Grains: Whole-grain pasta. Brown rice. Bulgar wheat. Polenta. Couscous. Whole-wheat bread. [place] Madeira. Vegetables: Artichokes. Beets. Broccoli. Cabbage. Carrots. Eggplant. Green beans. Chard. Kale. Spinach. Onions. Leeks. Peas. Squash. Tomatoes. Peppers. Radishes. Fruits: Apples. Apricots. Avocado. Berries. Bananas. Cherries. Dates. Figs. Grapes. Lemons. Melon. Oranges. Peaches. Plums. Pomegranate. Meats and other protein foods: Beans. Almonds. Sunflower seeds. Pine nuts. Peanuts. Cod. Salmon. Scallops. Shrimp. Tuna. Tilapia. Clams. Oysters. Eggs. Dairy: Low-fat milk. Cheese. Greek yogurt. Beverages: Water. Red wine. Herbal tea. Fats and oils: Extra virgin olive oil. Avocado oil. Grape seed oil. Sweets and desserts: Greek yogurt with honey. Baked apples. Poached pears. Trail mix. Seasoning and other foods: Basil. Cilantro. Coriander. Cumin. Mint. Parsley. Sage. Rosemary. Tarragon.  Garlic. Oregano. Thyme. Pepper. Balsalmic vinegar. Tahini. Hummus. Tomato sauce. Olives. Mushrooms. Limit these Grains: Prepackaged pasta or rice dishes. Prepackaged cereal with added sugar. Vegetables: Deep fried potatoes (french fries). Fruits: Fruit canned in syrup. Meats and other protein foods: Beef. Pork. Lamb. Poultry with skin. Hot dogs. [place]. Dairy: Ice cream. Sour cream. Whole milk. Beverages: Juice. Sugar-sweetened soft drinks. Beer. Liquor and spirits. Fats and oils: Butter. Canola oil. Vegetable oil. Beef fat (tallow). Lard. Sweets and desserts: Cookies. Cakes. Pies. Candy. Seasoning and other foods: Mayonnaise. Premade sauces and marinades. The items listed may not be a complete list. Talk with your dietitian about what dietary choices are right for you. Summary The Mediterranean diet includes both food and lifestyle choices. Eat a variety of fresh fruits and vegetables, beans, nuts, seeds, and whole grains. Limit the amount of red meat and sweets that you eat. Talk with your health care provider about whether it is safe for you to drink red wine in moderation. This means 1 glass a day for nonpregnant women and 2 glasses a day for men. A glass of wine equals 5 oz (150 mL). This information is not intended to replace advice given to you by your health care provider. Make sure you discuss any questions you have with  your health care provider. Document Released: 02/13/2016 Document Revised: 03/17/2016 Document Reviewed: 02/13/2016 Elsevier Interactive Patient Education  2017 [employer].

## 2024-06-06 ENCOUNTER — Other Ambulatory Visit

## 2024-06-06 ENCOUNTER — Ambulatory Visit: Payer: Self-pay | Admitting: Physician Assistant

## 2024-06-07 LAB — VITAMIN B6: Vitamin B6: 57 ng/mL — ABNORMAL HIGH (ref 2.1–21.7)

## 2024-06-07 LAB — HEMOGLOBIN A1C
Hgb A1c MFr Bld: 5.3 % (ref <5.7)
Mean Plasma Glucose: 105 mg/dL
eAG (mmol/L): 5.8 mmol/L

## 2024-06-07 NOTE — Telephone Encounter (Signed)
 Patient 's husband Carlin returned call to office.  States that there was two orders for Brain MRI  Vermell had ordered with and without contrast  Lauraine Montclair , PA with [facility] has ordered without .  Patient wanting to know how to  proceed.   Spoke with Vermell Bologna, PA and was told that she would recommend going with what Neurology would recommend.   Patient husband informed.

## 2024-06-07 NOTE — Telephone Encounter (Signed)
 Spoke with patient husband hydrologist. Gave the number to [employer] for scheduling the MRI- states that the neurology referral is requiring this as well before will see her. He will reach back out to me if still having issues with scheduling  this.

## 2024-06-09 ENCOUNTER — Ambulatory Visit (INDEPENDENT_AMBULATORY_CARE_PROVIDER_SITE_OTHER)

## 2024-06-09 VITALS — BP 149/72 | HR 72 | Resp 20 | Ht <= 58 in | Wt 143.0 lb

## 2024-06-09 DIAGNOSIS — R030 Elevated blood-pressure reading, without diagnosis of hypertension: Secondary | ICD-10-CM

## 2024-06-09 MED ORDER — LOSARTAN POTASSIUM 50 MG PO TABS: 50.0000 mg | ORAL_TABLET | Freq: Every day | ORAL | 1 refills | Status: DC

## 2024-06-09 NOTE — Progress Notes (Signed)
   Subjective:    Patient ID: Regina Walters, female    DOB: [birth year], 78 y.o.   MRN: [id number]  HPI  Patient is here for her BP check. Currently taking losartan  25mg . Denies CP, SOB, palpitations, medication problems, or vision changes.  Review of Systems     Objective:   Physical Exam        Assessment & Plan:   Patients first BP reading is 168/72. Second is 149/72. Reported to PCP [facility] who would like to increase patients losartan  25 mg to 50 mg. New prescription sent in today per Vermell Bologna, PA-C

## 2024-06-12 ENCOUNTER — Other Ambulatory Visit

## 2024-06-12 DIAGNOSIS — R413 Other amnesia: Secondary | ICD-10-CM | POA: Diagnosis not present

## 2024-06-15 NOTE — Progress Notes (Signed)
 Unable to LVM.

## 2024-06-15 NOTE — Progress Notes (Signed)
Patient son advised

## 2024-06-19 ENCOUNTER — Other Ambulatory Visit: Payer: Self-pay | Admitting: Physician Assistant

## 2024-06-23 ENCOUNTER — Other Ambulatory Visit: Payer: Self-pay | Admitting: Physician Assistant

## 2024-06-23 ENCOUNTER — Ambulatory Visit

## 2024-06-23 VITALS — BP 157/70 | HR 75 | Resp 18 | Ht <= 58 in | Wt 143.0 lb

## 2024-06-23 DIAGNOSIS — R030 Elevated blood-pressure reading, without diagnosis of hypertension: Secondary | ICD-10-CM

## 2024-06-23 MED ORDER — LOSARTAN POTASSIUM 100 MG PO TABS: 100.0000 mg | ORAL_TABLET | Freq: Every day | ORAL | 2 refills | Status: AC

## 2024-06-23 NOTE — Progress Notes (Signed)
" ° °  Subjective:    Patient ID: Regina Walters, female    DOB: 06-24-1946, 78 y.o.   MRN: [id number]  HPI  Patient is here for a 2 wk BP check. Denies CP, SOB, medication changes, or vision problems.  Review of Systems     Objective:   Physical Exam        Assessment & Plan:   Patients first BP reading 156/64. Second is 157/70. Reported to PCP vermell who is going to increase pts losartan  to 100 mg tabs and RTC in 2 weeks for an NV recheck. Losartan  100 mg sent in today per Jade. "

## 2024-06-27 ENCOUNTER — Ambulatory Visit: Payer: Self-pay

## 2024-06-27 ENCOUNTER — Telehealth: Payer: Self-pay

## 2024-06-27 ENCOUNTER — Other Ambulatory Visit: Payer: Self-pay | Admitting: Physician Assistant

## 2024-06-27 DIAGNOSIS — R300 Dysuria: Secondary | ICD-10-CM

## 2024-06-27 MED ORDER — NITROFURANTOIN MONOHYD MACRO 100 MG PO CAPS: 100.0000 mg | ORAL_CAPSULE | Freq: Two times a day (BID) | ORAL | 0 refills | Status: AC

## 2024-06-27 NOTE — Telephone Encounter (Signed)
Meds ordered this encounter  Medications   nitrofurantoin, macrocrystal-monohydrate, (MACROBID) 100 MG capsule    Sig: Take 1 capsule (100 mg total) by mouth 2 (two) times daily.    Dispense:  10 capsule    Refill:  0    

## 2024-06-27 NOTE — Telephone Encounter (Signed)
 FYI Only or Action Required?: Action required by provider: request for appointment and clinical question for provider. Pt requesting medication be sent in for a possible UTI  Patient was last seen in primary care on 05/09/2024 by Breeback, Jade L, PA-C.  Called Nurse Triage reporting Urinary Tract Infection.  Symptoms began several days ago.  Interventions attempted: OTC medications: AZO.  Symptoms are: unchanged.  Triage Disposition: See Physician Within 24 Hours  Patient/caregiver understands and will follow disposition?: No, wishes to speak with PCP  Copied from CRM #[id number]. Topic: Clinical - Red Word Triage >> Jun 27, 2024  9:48 AM Gustabo D wrote: Pt has a UTI she took AZO for 2 days over the weekend and isn't getting better. Says it's been about 3 days. Started bothering her Friday. Reason for Disposition  Urinating more frequently than usual (i.e., frequency) OR new-onset of the feeling of an urgent need to urinate (i.e., urgency)  Answer Assessment - Initial Assessment Questions 1. SYMPTOM: What's the main symptom you're concerned about? (e.g., frequency, incontinence)     Is having to bear down to urinate 2. ONSET: When did the  it  start?     3 days ago 3. PAIN: Is there any pain? If Yes, ask: How bad is it? (Scale: 1-10; mild, moderate, severe)     moderate 4. CAUSE: What do you think is causing the symptoms?     UTI 5. OTHER SYMPTOMS: Do you have any other symptoms? (e.g., blood in urine, fever, flank pain, pain with urination)     Denies  Pt does not want an appt states that she has been to clinic about 4 times since thanksgiving, would prefer an Rx be sent in for her. States she has hx of UTIs and this feels the same.  Protocols used: Urinary Symptoms-A-AH

## 2024-06-27 NOTE — Telephone Encounter (Signed)
 Spoke with patient husband  He states he was told that a prescription was being called to pharmacy for the patient as she has UTI Symptoms of pain with urination, frequency, lower abdomen pressure. States she is certain she has UTI as has had a lot in past.  They went to pharmacy and no medication was there for pick up . He is very frustrated that no medication was sent in and no call received that was not sent .   In chart shows that urinalysis and urine culture were sent by lab and not POC testing   Spoke with Dr. Alvan as patient had already left fo the day.  Per Dr. Alvan will send in a prescription for the patient - patietn and husband informed-verified that no allergies to any medications known and that they are requesting rx be sent to [facility] [place].

## 2024-07-02 LAB — URINE CULTURE

## 2024-07-02 LAB — URINALYSIS, ROUTINE W REFLEX MICROSCOPIC
Bilirubin, UA: NEGATIVE
Glucose, UA: NEGATIVE
Ketones, UA: NEGATIVE
Nitrite, UA: POSITIVE — AB
Protein,UA: NEGATIVE
RBC, UA: NEGATIVE
Specific Gravity, UA: 1.008 (ref 1.005–1.030)
Urobilinogen, Ur: 1 mg/dL (ref 0.2–1.0)
pH, UA: 6 (ref 5.0–7.5)

## 2024-07-02 LAB — MICROSCOPIC EXAMINATION
Casts: NONE SEEN /LPF
RBC, Urine: NONE SEEN /HPF (ref 0–2)

## 2024-07-03 ENCOUNTER — Ambulatory Visit: Payer: Self-pay | Admitting: Physician Assistant

## 2024-07-03 MED ORDER — AMOXICILLIN-POT CLAVULANATE 500-125 MG PO TABS: 1.0000 | ORAL_TABLET | Freq: Two times a day (BID) | ORAL | 0 refills | Status: AC

## 2024-07-03 NOTE — Progress Notes (Signed)
 Augmentin sent to pharmacy for UTI

## 2024-07-05 ENCOUNTER — Ambulatory Visit: Payer: Self-pay

## 2024-07-05 ENCOUNTER — Ambulatory Visit: Admitting: Psychology

## 2024-07-05 DIAGNOSIS — F0670 Mild neurocognitive disorder due to known physiological condition without behavioral disturbance: Secondary | ICD-10-CM

## 2024-07-05 DIAGNOSIS — R4189 Other symptoms and signs involving cognitive functions and awareness: Secondary | ICD-10-CM

## 2024-07-05 NOTE — Progress Notes (Signed)
 "  NEUROPSYCHOLOGICAL EVALUATION [place]. [facility]  [place] Department of Neurology  Date of Evaluation: 07/05/2024  REASON FOR REFERRAL   Regina Walters is a 78 year old, right-handed, White female with 11 years of formal education. She was referred for neuropsychological evaluation by Camie Sevin, PA-C, to assess current neurocognitive functioning, document potential cognitive deficits, and assist with treatment planning. This is her first neuropsychological evaluation.  SUMMARY OF RESULTS   In light of post-cataract surgery visual complications requiring revision, results on visually-mediated tasks should be interpreted with caution.  Premorbid cognitive abilities are estimated to be in the low average range based on word reading and sociodemographic factors. Relative to this estimated baseline, current performance was below expectations across measures of processing speed, executive functioning, language, visuoconstruction, and select aspects of learning/memory. In comparison, working memory was consistently intact.  Performance across all measures of processing speed was slow, though it is notable that all measures were visually demanding. Executive measures assessing alternating attention and response inhibition were discontinued; the degree to which this reflected visual limitations versus executive dysfunction is unclear. Verbal abstract reasoning was intact. Verbal fluency and confrontation naming were below expectations, while sentence repetition was preserved. Visuoconstructional performance was below expectations on tasks involving copying simple figures and constructing block designs, whereas performance on a task of judging line orientations was intact.  Learning/memory abilities were largely within expectations. Immediate recall of a word list was low; however, she demonstrated adequate retention of learned information and intact recognition, with only minimal  errors. Similarly, immediate recall of short stories was below expectations, while delayed recall and recognition remained intact. Performance was within expectations for the encoding, recall, and recognition of shapes, though her drawings were notably sloppy and imprecise (scoring was lenient in consideration of visual difficulties).   On self-report measures, she endorsed mild symptoms of depression and minimal symptoms of anxiety.  DIAGNOSTIC IMPRESSION   Results of the current evaluation indicated deficits across multiple cognitive domains. However, given the likelihood that visual limitations contributed substantially to many of these difficulties, it is difficult to determine her true pattern of cognitive functioning in the absence of vision impairment. She meets criteria for mild cognitive impairment at this time, but retesting following lens revision is recommended to clarify the impact of vision on her performance and to better characterize her cognitive profile. Encouragingly, memory performance was generally preserved despite variability in other domains. While cerebrovascular changes may contribute to some observed difficulties, MRI findings revealed only mild white matter disease at this time. Additional factors that could exacerbate cognitive difficulties include chronic sleep disturbance and reduced social and cognitive stimulation.  ICD-10 Codes: F06.70 Mild neurocognitive disorder (mild cognitive impairment)  RECOMMENDATIONS   A repeat neuropsychological evaluation in 12-18 months is recommended, following lens revision.  Patient has already been prescribed a medication (i.e., donepezil ) aimed at addressing memory concerns. She is encouraged to continue taking this medication as prescribed. It is important to highlight that this medication has been shown to slow functional decline in some individuals.  Given ongoing sleep difficulties, she may benefit from the implementation of sleep  hygiene techniques, including:  Go to bed and get up at the same time each day to help your body establish a regular rhythm. Establish and maintain a bedtime routine. Certain activities such as stretching, meditating, listening to soft music, or reading ~15 minutes before bedtime can be a great way to regularly get your brain and body ready for sleep. Avoid taking naps during the day.  Avoid alcohol and caffeine for 5 or 6 hours before going to bed. Get regular exercise, but not in the hours before bedtime. Use comfortable bedding and maintain a cool temperature in your bedroom. Block out light and distracting noise. Avoid watching television or using your phone/computer in bed. Avoid staying in bed if you have difficulty falling asleep. If you have not been able to get to sleep after about 20 minutes or more, get up and do something calming or boring until you feel sleepy, then return to bed and try again.  Prioritize physical health through diet, exercise, and sleep. Regular physical activity supports cardiovascular health, improves mood, and helps preserve mobility and independence. Aim for at least 150 minutes of moderate aerobic exercise per week (e.g., brisk walking, swimming, gardening). A brain-healthy diet such as the Mediterranean or MIND diet is rich in fruits, vegetables, whole grains, healthy fats, and lean proteins, and has been associated with reduced risk of cognitive decline. Additionally, getting adequate, quality sleep and managing chronic conditions with the help of healthcare providers are essential components of healthy aging.  Continue to stay socially and mentally engaged. Maintaining strong social connections and regularly stimulating your brain can help protect against cognitive decline. This includes staying connected with friends and family, volunteering, or participating in community groups. Mentally engaging activities--such as reading, doing puzzles, playing strategy  games, or learning a new language or musical instrument--promote brain plasticity. If you are interested in activities to support cognitive engagement, this site offers a variety of apps and games organized by difficulty level:  https://www.[facility].org/get-to-know-barrow/centers-programs/neurorehabilitation-center/neuro-rehab-apps-and-games/  Consider implementing compensatory strategies to maximize independence and maintain daily functioning. Examples include:  Adhere to routine. Compensatory strategies work best when they are used consistently. Use a planner, calendar, or white board that has the schedule and important events for the day clearly listed to reference and cross off when tasks are complete.  Ask for written information, especially if it is new or unfamiliar (e.g., information provided at a doctor's appointment).  Create an organized environment. Keep items that can be easily misplaced in a sensible location and get into the habit of always returning the items to those places. Pay attention and reduce distractions. Make a point of focusing attention on information you want to remember. One-on-one interaction is more likely to facilitate attention and minimize distraction. Make eye contact and repeat the information out loud after you hear it. Reduce interruptions or distractions especially when attempting to learn new information.  Create associations. When learning something new, think about and understand the information. Explain it in your own words or try to associate it with something you already know. Take notes to help remember important details. Evaluate goals and plan accordingly. When confronted by many different tasks, begin by making a list that prioritizes each task and estimates the time it will take to complete. Break down complicated tasks into smaller, more manageable steps. Focus on one task at a time and complete each task before starting another. Avoid  multitasking.  DISPOSITION   Patient will follow up with the referring provider, Ms. Wertman. She should return for repeat neuropsychological testing in 12-18 months to monitor her course and assist with diagnosis and treatment planning. She and her husband will be provided verbal feedback in approximately one week regarding the findings and impression during this visit.  The remainder of the report includes the details of the patient's background and a table of results from the current evaluation, which support the summary and recommendations  described above.  BACKGROUND   History of Presenting Illness: The following information was obtained from a review of medical records and an interview with the patient and her husband, Carlin. Briefly, the patient was evaluated by Camie Sevin, PA-C, at [facility] on 06/05/2024 for cognitive concerns over the past year, with worsening over the past few months. Symptoms include short-term memory and word finding difficulties. MoCA = 17/30. She was referred for neuropsychological evaluation accordingly.  Cognitive Functioning: During todays appointment, the patient reported cognitive changes over the past few months, whereas her husband reported changes over the past 6 to 12 months. Both indicated that these changes have remained stable. Short-term memory was identified as the primary concern, including forgetting details of conversations and misplacing items. Patients husband denied concerns about repetitive questioning or statements. Both noted word-finding difficulty, in which she knows the word she intends to use but cannot immediately retrieve it, though she is typically able to do so later. They otherwise denied significant concerns related to attention, comprehension, visuospatial abilities, or executive functioning (e.g., planning, organizing).  Physical Functioning: Patient reported chronic sleep difficulties. Although she often has difficulty  falling asleep, her primary concern is sleep maintenance. For example, she reported awakening around 3:00 a.m. last night and remaining awake thereafter. She has never undergone a sleep study. Her appetite is stable, with no reported changes in sense of smell or taste. Vision is largely stable; however, she reported difficulty with the lens in her left eye following cataract surgery, which is scheduled for revision next month. Hearing is stable. She endorsed ongoing balance difficulties and a history of falls, including instances in which she struck her head; however, she denied sustaining any serious head injuries. She currently does in chair-based exercises to strengthen her legs and improve balance. She has not engaged in recent physical therapy. She denied tremors.  Emotional Functioning: Patient described her recent mood as fine and denied suicidal ideation, which was corroborated by her husband. She reported spending the majority of her time at home watching television.  Neuroimaging: MRI of the brain (06/12/2024) documented mild atrophy and mild chronic white matter changes.  Other Relevant Medical History: Remarkable for hyperlipidemia, syncope, and gastroesophageal reflux disease. Please refer to the medical record for a more comprehensive problem list. No history of stroke, CNS infection, head injury, or seizure was reported.  Current Medications: Per record, amitriptyline , amoxicillin -clavulanate, atorvastatin , calcium -vitamin D , denosumab , donepezil , esomeprazole, ibuprofen, joint support, losartan , nitrofurantoin , potassium, and Prevagen.   Functional Status: Patient remains largely independent in both basic and instrumental activities of daily living. She manages her medications independently without reported difficulty. Her husband has managed the household finances for several decades, having assumed this role due to budgeting needs related to their financial resources and  responsibilities. She has never driven. She is able to prepare meals and operate household appliances independently and without difficulty.  Family Neurological History: Remarkable for dementia in the patient's mother (died at the age of 10).  Psychiatric History: History of depression, anxiety, prior mental health treatment, suicidal ideation, hallucinations, and psychiatric hospitalizations was not reported.  Substance Use History: Patient denied current use of alcohol, nicotine, marijuana, and other illicit substances.. Additionally, there is no reported history of problematic substance use.  Social and Developmental History: Patient was born in [place] . History of perinatal complications and developmental delays was not reported. She is married and lives with her husband. They have four children.  Educational and Occupational History: No history of childhood learning disability, special  education services, or grade retention was reported. 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 She left school during the 12th grade due to pregnancy. She was never employed outside the home but devoted her time to raising her children.  BEHAVIORAL OBSERVATIONS   Patient arrived on time and was accompanied by her husband, Carlin. She ambulated with a cane. She was alert and fully oriented. She was appropriately groomed and dressed for the setting. No significant motor abnormalities were observed. Visual limitations were observed to negatively influence performance, most notably on timed tasks with significant visual demands. Hearing was adequate for testing purposes. Speech was of normal rate, prosody, and volume. No conversational word-finding difficulties, paraphasic errors, or dysarthria were observed. Comprehension was conversationally intact. Thought processes were linear, logical, and coherent. Thought content was organized and devoid of delusions. Insight appeared appropriate.  Affect was even and congruent with euthymic mood. She was cooperative and appeared to give adequate effort during testing, including on embedded measures of performance validity. Results are thought to accurately reflect her cognitive functioning at this time.  NEUROPSYCHOLOGICAL TESTING RESULTS   Tests Administered: Animal Naming Test; Brief Visuospatial Memory Test-Revised (BVMT-R) - Form 1; [place]  Verbal Learning Test Third Edition (CVLT3) - Brief Form; Controlled Oral Word Association Test (COWAT): FAS; Delis-Kaplan Executive Function System (D-KEFS) - Subtest(s): Color-Word Interference Test; Geriatric Anxiety Scale-10 Item (GAS-10); Geriatric Depression Scale Short Form (GDS-SF); Multilingual Aphasia Examination (MAE) - Subtest(s): Sentence Repetition; Neuropsychological Assessment Battery (NAB) - Subtest(s): Naming Form 1; Repeatable Battery for the Assessment of Neuropsychological Status Update (RBANS Update) Form A - Subtest(s): Line Orientation; Test of Premorbid Functioning (TOPF); Trail Making Test (TMT); Wechsler Adult Intelligence Scale Fifth Edition (WAIS-5) - Subtest(s): Similarities, Clinical Cytogeneticist, Digits Forward, Digit Sequencing, Coding, Symbol Search, Digits Backward; and Wechsler Memory Scale Fourth Edition (WMS-IV) - Subtest(s): Logical Memory (LM).  Test results are provided in the table below. Whenever possible, the patient's scores were compared against age-, sex-, and education-corrected normative samples. Interpretive descriptions are based on the AACN consensus conference statement on uniform labeling (Guilmette et al., 2020).  PREMORBID FUNCTIONING RAW  RANGE  TOPF 23 StdS=85 Low Average  ATTENTION & WORKING MEMORY RAW  RANGE  WAIS-5 Digits Forward -- ss=9 Average  WAIS-5 Digits Backward -- ss=6 Low Average  WAIS-5 Digit Sequencing -- ss=8 Average  PROCESSING SPEED RAW  RANGE  Trails A 189''2e T=24 Exceptionally Low  WAIS-5 Coding  -- ss=4 Below Average  WAIS-5 Symbol  Search -- ss=4 Below Average  DKEFS CWIT Color Naming 85''3e ss=1 Exceptionally Low  DKEFS CWIT Word Reading 54''1e ss=1 Exceptionally Low  EXECUTIVE FUNCTION RAW  RANGE  Trails B D/C -- --  WAIS-5 Similarities -- ss=8 Average  COWAT Letter Fluency 3+1+6 T=24 Exceptionally Low  DKEFS CWIT Inhibition D/C -- --  DKEFS CWIT Inhibition/Switching D/C -- --  LANGUAGE RAW  RANGE  COWAT Letter Fluency 3+1+6 T=24 Exceptionally Low  Animal Naming Test 5 T=18 Exceptionally Low  NAB Naming Test 22/31 T=31 BNL  MAE Sentence Repetition** C/S=12/14 -- Average  VISUOSPATIAL RAW  RANGE  RBANS Line Orientation -- 10-16%ile Low Average  WAIS-5 Block Design -- ss=4 Below Average  BVMT-R Copy Trial 9/12 -- BNL  VERBAL LEARNING & MEMORY RAW  RANGE  CVLT3 Total 1-4 (3+4+4+6)/36 StdS=66 Exceptionally Low  CVLT3 SDFR  5/9 ss=6 Low Average  CVLT3 LDFR  5/9 ss=7 Low Average  CVLT3 LDCR  4/9 ss=4 Below Average  CVLT3 Recognition Hits 9 ss=13 High  Average  CVLT3 Recognition False+ 2 ss=7 Low Average  CVLT3 Discriminability -- ss=9 Average  CVLT3 Intrusions 1 ss=10 Average  CVLT3 Repetitions 1 ss=12 High Average  CVLT3 Forced Choice 9/9 -- WNL  WMS-IV LM-I  (3+6+6)/53 ss=5 Below Average  WMS-IV LM-II  (1+8)/39 ss=7 Low Average  WMS-IV LM Recognition  (8+11)/23 51-75%ile Average  VISUAL LEARNING & MEMORY RAW  RANGE  BVMT-R Total Recall (5+5+8)/36 T=46 Average  BVMT-R Delayed Recall 6/12 T=43 Average  BVMT-R Recognition Hits 6 >16%ile WNL  BVMT-R Recognition False Alarms 0 >16%ile WNL  BVMT-R Recognition Discrimination Index 6 >16%ile WNL  QUESTIONNAIRES RAW  RANGE  GDS-SF 5 -- Mild  GAS-10 4 -- Minimal  *Note: ss = scaled score; StdS = standard score; T = t-score; C/S = corrected raw score; WNL = within normal limits; BNL= below normal limits; D/C = discontinued. Scores from skewed distributions are typically interpreted as WNL (>=16th %ile) or BNL (<16th %ile). **Given the limited availability of  age-appropriate normative data, the patient was scored using the educational correction corresponding to the oldest age range within her educational category.   INFORMED CONSENT   Patient was provided with a verbal description of the nature and purpose of the neuropsychological evaluation. Also reviewed were the foreseeable risks and/or discomforts and benefits of the procedure, limits of confidentiality, and mandatory reporting requirements of this provider. Patient was given the opportunity to have their questions answered. Oral consent to participate was provided by the patient.   This report was prepared as part of a clinical evaluation and is not intended for forensic use.  SERVICE   This evaluation was conducted by Renda Beckwith, Psy.D. In addition to time spent directly with the patient, total professional time (180 minutes) includes record review, integration of relevant medical history, test selection, interpretation of findings, and report preparation. A technician, Evalene Pizza, B.S., provided testing and scoring assistance (156 minutes).  Psychiatric Diagnostic Evaluation Services (Professional): 09208 x 1 Neuropsychological Testing Evaluation Services (Professional): 03867 x 1 Neuropsychological Testing Evaluation Services (Professional): 03866 x 2 Neuropsychological Test Administration and Scoring (Technician): [phone] x 1 Neuropsychological Test Administration and Scoring (Technician): [phone] x 4  This report was generated using voice recognition software. While this document has been carefully reviewed, transcription errors may be present. I apologize in advance for any inconvenience. Please contact me if further clarification is needed.            Renda Beckwith, Psy.D.             Neuropsychologist  "

## 2024-07-05 NOTE — Progress Notes (Signed)
" ° °  Psychometrician Note   Cognitive testing was administered to Regina Walters by Regina Walters, B.S. (psychometrist) under the supervision of Regina Walters, Psy.D., licensed psychologist on 07/05/2024. Regina Walters did not appear overtly distressed by the testing session per behavioral observation or responses across self-report questionnaires. Rest breaks were offered.   The battery of tests administered was selected by Regina Walters, Psy.D. with consideration to Regina Walters's current level of functioning, the nature of her symptoms, emotional and behavioral responses during interview, level of literacy, observed level of motivation/effort, and the nature of the referral question. This battery was communicated to the psychometrist. Communication between Regina Walters, Psy.D. and the psychometrist was ongoing throughout the evaluation and Regina Walters, Psy.D. was immediately accessible at all times. Regina Walters, Psy.D. provided supervision to the psychometrist on the date of this service to the extent necessary to assure the quality of all services provided.    Regina Walters will return within approximately 1-2 weeks for an interactive feedback session with Dr. Beckwith at which time her test performances, clinical impressions, and treatment recommendations will be reviewed in detail. Regina Walters understands she can contact our office should she require our assistance before this time.  A total of 156 minutes of billable time were spent face-to-face with Regina Walters by the psychometrist. This includes both test administration and scoring time. Billing for these services is reflected in the clinical report generated by Regina Walters, Psy.D.  This note reflects time spent with the psychometrician and does not include test scores or any clinical interpretations made by Dr. Beckwith. The full report will follow in a separate note. "

## 2024-07-07 ENCOUNTER — Ambulatory Visit (INDEPENDENT_AMBULATORY_CARE_PROVIDER_SITE_OTHER)

## 2024-07-07 VITALS — BP 133/85 | HR 101 | Resp 18 | Ht <= 58 in | Wt 143.0 lb

## 2024-07-07 DIAGNOSIS — R030 Elevated blood-pressure reading, without diagnosis of hypertension: Secondary | ICD-10-CM

## 2024-07-07 NOTE — Progress Notes (Signed)
" ° °  Subjective:    Patient ID: Regina Walters, female    DOB: 07-Mar-1946, 79 y.o.   MRN: [id number]  HPI  Patient is here for a 2 wk BP check. Denies CP, SOB, medication changes, headaches, or vision problems. Patients losartan  increased to 100 mg from 50 mg.  Review of Systems     Objective:   Physical Exam        Assessment & Plan:   Patiens first BP is 133/85 reported to PCP [facility] who is statisfied with this reading. Would like the patient to cont. Same regimen "

## 2024-07-13 ENCOUNTER — Ambulatory Visit: Admitting: Psychology

## 2024-07-13 DIAGNOSIS — F0670 Mild neurocognitive disorder due to known physiological condition without behavioral disturbance: Secondary | ICD-10-CM

## 2024-07-13 NOTE — Progress Notes (Signed)
" ° °  NEUROPSYCHOLOGY FEEDBACK SESSION [place]. [facility]  [place] Department of Neurology  Date of Feedback Session: 07/13/2024  REASON FOR REFERRAL   Regina Walters is a 79 year old, right-handed, White female with 11 years of formal education. She was referred for neuropsychological evaluation by Camie Sevin, PA-C, to assess current neurocognitive functioning, document potential cognitive deficits, and assist with treatment planning. This is her first neuropsychological evaluation.  FEEDBACK   Patient completed a comprehensive neuropsychological evaluation on 07/05/2024. Please refer to that encounter for the full report and recommendations. Briefly, results indicated deficits across multiple cognitive domains. However, given the likelihood that visual limitations contributed substantially to many of these difficulties, it is difficult to determine her true pattern of cognitive functioning in the absence of vision impairment. She meets criteria for mild cognitive impairment at this time, but retesting following lens revision is recommended to clarify the impact of vision on her performance and to better characterize her cognitive profile. Encouragingly, memory performance was generally preserved despite variability in other domains. While cerebrovascular changes may contribute to some observed difficulties, MRI findings revealed only mild white matter disease at this time. Additional factors that could exacerbate cognitive difficulties include chronic sleep disturbance and reduced social and cognitive stimulation.   Today, the patient was accompanied by her husband. They were provided verbal feedback regarding the findings and impression during this visit, and their questions were answered. A copy of the report was provided at the conclusion of the visit.  DISPOSITION   Patient will follow up with the referring provider, Ms. Wertman. She should return for repeat neuropsychological  testing in 12-18 months to monitor her course and assist with diagnosis and treatment planning.  SERVICE   This feedback session was conducted by Renda Beckwith, Psy.D. [place] [id number] (35 minutes) was billed for Dr. Beckwith' time spent in preparing, conducting, and documenting the current feedback session.  This report was generated using voice recognition software. While this document has been carefully reviewed, transcription errors may be present. I apologize in advance for any inconvenience. Please contact me if further clarification is needed.  "

## 2024-08-01 ENCOUNTER — Encounter

## 2024-08-01 ENCOUNTER — Encounter: Payer: Self-pay | Admitting: Physician Assistant

## 2024-08-02 ENCOUNTER — Telehealth: Payer: Self-pay | Admitting: Physician Assistant

## 2024-08-02 NOTE — Telephone Encounter (Signed)
 Pt's husband says eye is healed and would like to acquire on Neuropathy testing for her foot numbness

## 2024-08-03 ENCOUNTER — Other Ambulatory Visit: Payer: Self-pay | Admitting: Physician Assistant

## 2024-08-03 ENCOUNTER — Other Ambulatory Visit: Payer: Self-pay

## 2024-08-03 DIAGNOSIS — R202 Paresthesia of skin: Secondary | ICD-10-CM

## 2024-08-03 NOTE — Telephone Encounter (Signed)
 I advised will order EMG

## 2024-08-03 NOTE — Telephone Encounter (Signed)
 I left message to call the office, will need to schedule for EMG, will discuss with patient and order if patient wants to do this.Order given from [place]

## 2024-08-22 ENCOUNTER — Ambulatory Visit: Admitting: Physician Assistant

## 2024-09-08 ENCOUNTER — Encounter: Payer: Self-pay | Admitting: Neurology

## 2024-10-09 ENCOUNTER — Ambulatory Visit: Admitting: Physician Assistant

## 2024-11-23 ENCOUNTER — Ambulatory Visit
